# Patient Record
Sex: Female | Born: 1970 | Race: Black or African American | Hispanic: No | Marital: Single | Smoking: Never smoker
Health system: Southern US, Community
[De-identification: ages and names within clinical notes are randomized; demographics above are authoritative.]

## PROBLEM LIST (undated history)

## (undated) DIAGNOSIS — G5732 Lesion of lateral popliteal nerve, left lower limb: Principal | ICD-10-CM

## (undated) DIAGNOSIS — E119 Type 2 diabetes mellitus without complications: Secondary | ICD-10-CM

## (undated) DIAGNOSIS — D649 Anemia, unspecified: Secondary | ICD-10-CM

## (undated) HISTORY — DX: Lesion of lateral popliteal nerve, left lower limb: G57.32

## (undated) HISTORY — PX: OTHER SURGICAL HISTORY: SHX169

---

## 2000-01-18 ENCOUNTER — Emergency Department (HOSPITAL_COMMUNITY): Admission: EM | Admit: 2000-01-18 | Discharge: 2000-01-18 | Payer: Self-pay | Admitting: Internal Medicine

## 2000-01-18 ENCOUNTER — Encounter: Payer: Self-pay | Admitting: Internal Medicine

## 2004-06-19 ENCOUNTER — Emergency Department (HOSPITAL_COMMUNITY): Admission: EM | Admit: 2004-06-19 | Discharge: 2004-06-20 | Payer: Self-pay | Admitting: Emergency Medicine

## 2005-02-10 ENCOUNTER — Emergency Department (HOSPITAL_COMMUNITY): Admission: EM | Admit: 2005-02-10 | Discharge: 2005-02-10 | Payer: Self-pay | Admitting: Emergency Medicine

## 2008-02-11 ENCOUNTER — Emergency Department (HOSPITAL_COMMUNITY): Admission: EM | Admit: 2008-02-11 | Discharge: 2008-02-11 | Payer: Self-pay | Admitting: Emergency Medicine

## 2009-02-04 ENCOUNTER — Inpatient Hospital Stay (HOSPITAL_COMMUNITY): Admission: AD | Admit: 2009-02-04 | Discharge: 2009-02-04 | Payer: Self-pay | Admitting: Obstetrics & Gynecology

## 2009-02-04 ENCOUNTER — Ambulatory Visit: Payer: Self-pay | Admitting: Obstetrics and Gynecology

## 2009-02-12 ENCOUNTER — Emergency Department: Payer: Self-pay | Admitting: Emergency Medicine

## 2010-10-15 LAB — TSH: TSH: 0.685 u[IU]/mL (ref 0.350–4.500)

## 2010-10-15 LAB — CBC
HCT: 26.1 % — ABNORMAL LOW (ref 36.0–46.0)
Hemoglobin: 8.8 g/dL — ABNORMAL LOW (ref 12.0–15.0)
MCHC: 33.5 g/dL (ref 30.0–36.0)
MCV: 83 fL (ref 78.0–100.0)
Platelets: 365 10*3/uL (ref 150–400)
RBC: 3.15 MIL/uL — ABNORMAL LOW (ref 3.87–5.11)
RDW: 14.9 % (ref 11.5–15.5)
WBC: 6.9 10*3/uL (ref 4.0–10.5)

## 2010-10-15 LAB — COMPREHENSIVE METABOLIC PANEL
ALT: 17 U/L (ref 0–35)
AST: 20 U/L (ref 0–37)
Albumin: 3.6 g/dL (ref 3.5–5.2)
Alkaline Phosphatase: 76 U/L (ref 39–117)
BUN: 9 mg/dL (ref 6–23)
CO2: 27 mEq/L (ref 19–32)
Calcium: 8.7 mg/dL (ref 8.4–10.5)
Chloride: 105 mEq/L (ref 96–112)
Creatinine, Ser: 0.71 mg/dL (ref 0.4–1.2)
GFR calc Af Amer: >60 mL/min (ref >60)
GFR calc non Af Amer: >60 mL/min (ref >60)
Glucose, Bld: 105 mg/dL — ABNORMAL HIGH (ref 70–99)
Potassium: 3.5 mEq/L (ref 3.5–5.1)
Sodium: 139 mEq/L (ref 135–145)
Total Bilirubin: 0.2 mg/dL — ABNORMAL LOW (ref 0.3–1.2)
Total Protein: 7.6 g/dL (ref 6.0–8.3)

## 2010-10-15 LAB — HCG, SERUM, QUALITATIVE: Preg, Serum: NEGATIVE

## 2010-10-15 LAB — GC/CHLAMYDIA PROBE AMP, GENITAL
Chlamydia, DNA Probe: NEGATIVE
GC Probe Amp, Genital: NEGATIVE

## 2011-10-30 ENCOUNTER — Other Ambulatory Visit (HOSPITAL_COMMUNITY): Payer: Self-pay | Admitting: Physician Assistant

## 2011-10-30 DIAGNOSIS — Z1231 Encounter for screening mammogram for malignant neoplasm of breast: Secondary | ICD-10-CM

## 2011-11-22 ENCOUNTER — Ambulatory Visit (HOSPITAL_COMMUNITY): Payer: Self-pay

## 2011-11-26 ENCOUNTER — Ambulatory Visit (HOSPITAL_COMMUNITY)
Admission: RE | Admit: 2011-11-26 | Discharge: 2011-11-26 | Disposition: A | Discharge status: Home / Self Care | Payer: Self-pay | Source: Ambulatory Visit | Attending: Physician Assistant | Admitting: Physician Assistant

## 2011-11-26 DIAGNOSIS — Z1231 Encounter for screening mammogram for malignant neoplasm of breast: Secondary | ICD-10-CM

## 2013-08-05 ENCOUNTER — Other Ambulatory Visit: Payer: Self-pay | Admitting: Obstetrics and Gynecology

## 2013-08-05 DIAGNOSIS — Z1231 Encounter for screening mammogram for malignant neoplasm of breast: Secondary | ICD-10-CM

## 2013-09-08 ENCOUNTER — Ambulatory Visit (HOSPITAL_COMMUNITY): Payer: Self-pay

## 2013-09-29 ENCOUNTER — Ambulatory Visit (HOSPITAL_COMMUNITY)
Admission: RE | Admit: 2013-09-29 | Discharge: 2013-09-29 | Disposition: A | Discharge status: Home / Self Care | Payer: Self-pay | Source: Ambulatory Visit | Attending: Obstetrics and Gynecology | Admitting: Obstetrics and Gynecology

## 2013-09-29 ENCOUNTER — Ambulatory Visit (HOSPITAL_COMMUNITY): Payer: Self-pay

## 2013-09-29 DIAGNOSIS — Z1231 Encounter for screening mammogram for malignant neoplasm of breast: Secondary | ICD-10-CM

## 2014-01-19 ENCOUNTER — Ambulatory Visit: Payer: Self-pay

## 2014-01-26 ENCOUNTER — Ambulatory Visit: Payer: Self-pay

## 2014-01-28 ENCOUNTER — Ambulatory Visit: Payer: Self-pay

## 2014-02-02 ENCOUNTER — Ambulatory Visit: Payer: Self-pay

## 2014-02-04 ENCOUNTER — Ambulatory Visit: Payer: Self-pay

## 2014-02-11 ENCOUNTER — Ambulatory Visit: Payer: Self-pay

## 2014-09-22 ENCOUNTER — Encounter (HOSPITAL_COMMUNITY): Payer: Self-pay | Admitting: Emergency Medicine

## 2014-09-22 ENCOUNTER — Emergency Department (HOSPITAL_COMMUNITY)
Admission: EM | Admit: 2014-09-22 | Discharge: 2014-09-22 | Disposition: A | Discharge status: Home / Self Care | Payer: 59 | Source: Home / Self Care | Attending: Family Medicine | Admitting: Family Medicine

## 2014-09-22 DIAGNOSIS — K088 Other specified disorders of teeth and supporting structures: Secondary | ICD-10-CM

## 2014-09-22 DIAGNOSIS — K0889 Other specified disorders of teeth and supporting structures: Secondary | ICD-10-CM

## 2014-09-22 MED ORDER — CLINDAMYCIN HCL 300 MG PO CAPS: 300.0000 mg | ORAL_CAPSULE | Freq: Three times a day (TID) | ORAL | Status: DC

## 2014-09-22 MED ORDER — DICLOFENAC SODIUM 75 MG PO TBEC: 75.0000 mg | DELAYED_RELEASE_TABLET | Freq: Two times a day (BID) | ORAL | Status: DC | PRN

## 2014-09-22 NOTE — Discharge Instructions (Signed)
Thank you for coming in today. Call or go to the emergency room if you get worse, have trouble breathing, have chest pains, or palpitations.    Dental Pain A tooth ache may be caused by cavities (tooth decay). Cavities expose the nerve of the tooth to air and hot or cold temperatures. It may come from an infection or abscess (also called a boil or furuncle) around your tooth. It is also often caused by dental caries (tooth decay). This causes the pain you are having. DIAGNOSIS  Your caregiver can diagnose this problem by exam. TREATMENT   If caused by an infection, it may be treated with medications which kill germs (antibiotics) and pain medications as prescribed by your caregiver. Take medications as directed.  Only take over-the-counter or prescription medicines for pain, discomfort, or fever as directed by your caregiver.  Whether the tooth ache today is caused by infection or dental disease, you should see your dentist as soon as possible for further care. SEEK MEDICAL CARE IF: The exam and treatment you received today has been provided on an emergency basis only. This is not a substitute for complete medical or dental care. If your problem worsens or new problems (symptoms) appear, and you are unable to meet with your dentist, call or return to this location. SEEK IMMEDIATE MEDICAL CARE IF:   You have a fever.  You develop redness and swelling of your face, jaw, or neck.  You are unable to open your mouth.  You have severe pain uncontrolled by pain medicine. MAKE SURE YOU:   Understand these instructions.  Will watch your condition.  Will get help right away if you are not doing well or get worse. Document Released: 06/25/2005 Document Revised: 09/17/2011 Document Reviewed: 02/11/2008 [facility] Patient Information 2015 [place] [place]. This information is not intended to replace advice given to you by your health care provider. Make sure you discuss any questions you have with  your health care provider.  [facility]:  [facility] [phone] ([phone])  [phone]  Please call Dr. Lawrence Marseillesivils office [phone] or cell [phone] [address], [place]  Cost for tooth removal $200 includes exam, Xray, and extraction and follow up visit.  Bring list of current medications with you.   [facility] - [phone][phone]  [facility] [phone]  [address] [facility]  [facility]  [address], [place], [place], [address]  [phone], Ext. [phone]  2nd and 4th Thursday of the month at 6:30am (Simple extractions only - no wisdom teeth or surgery) First come/First serve -First 10 clients served  [facility]([place] residents only)  [address], [place], [place], [address]  336 [phone]  [facility]  336 [phone]  [facility]  336 [phone]  [facility]  [phone]  Please call [facility] at [phone] to get the details to get your tooth pulled.

## 2014-09-22 NOTE — ED Notes (Signed)
C/o left side jaw and ear pain onset 2 days Sx also include teeth pain Denies fevers, chills Alert, no signs of acute distress.

## 2014-09-22 NOTE — ED Provider Notes (Addendum)
Melinda Cross is a 44 y.o. female who presents to Urgent Care today for left mandible pain radiating to the left ear present for 3 weeks worsening this last week. Patient has tried Sudafed which did not help. She notes mild rhinitis. No fevers chills vomiting or diarrhea. No chest pain palpitations or shortness of breath.   History reviewed. No pertinent past medical history. History reviewed. No pertinent past surgical history. History  Substance Use Topics  . Smoking status: Never Smoker   . Smokeless tobacco: Not on file  . Alcohol Use: No   ROS as above Medications: No current facility-administered medications for this encounter.   Current Outpatient Prescriptions  Medication Sig Dispense Refill  . clindamycin (CLEOCIN) 300 MG capsule Take 1 capsule (300 mg total) by mouth 3 (three) times daily. 30 capsule 0  . diclofenac (VOLTAREN) 75 MG EC tablet Take 1 tablet (75 mg total) by mouth 2 (two) times daily as needed. 30 tablet 0   No Known Allergies   Exam:  BP 144/90 mmHg  Pulse 91  Temp(Src) 98.8 F (37.1 C) (Oral)  Resp 18  SpO2 97% Gen: Well NAD HEENT: EOMI,  MMM left lower premolar tender to touch. Tympanic membranes are normal appearing bilaterally. Sinuses are nontender bilaterally. Minimal nasal discharge present. Lungs: Normal work of breathing. CTABL Heart: RRR no MRG Abd: NABS, Soft. Nondistended, Nontender Exts: Brisk capillary refill, warm and well perfused.   No results found for this or any previous visit (from the past 24 hour(s)). No results found.  Assessment and Plan: 44 y.o. female with left mandible dental pain. Treat with clindamycin and diclofenac. Follow-up with dentist. Use clindamycin because patient is allergic to penicillin.  Discussed warning signs or symptoms. Please see discharge instructions. Patient expresses understanding.     Rodolph BongEvan S Corey, MD 09/22/14 1827  Rodolph BongEvan S Corey, MD 09/22/14 [phone]

## 2015-11-27 ENCOUNTER — Encounter (HOSPITAL_COMMUNITY): Payer: Self-pay | Admitting: Family Medicine

## 2015-11-27 ENCOUNTER — Emergency Department (HOSPITAL_COMMUNITY)
Admission: EM | Admit: 2015-11-27 | Discharge: 2015-11-27 | Disposition: A | Discharge status: Home / Self Care | Payer: BLUE CROSS/BLUE SHIELD | Attending: Emergency Medicine | Admitting: Emergency Medicine

## 2015-11-27 ENCOUNTER — Emergency Department (HOSPITAL_COMMUNITY): Payer: BLUE CROSS/BLUE SHIELD

## 2015-11-27 DIAGNOSIS — Z792 Long term (current) use of antibiotics: Secondary | ICD-10-CM | POA: Insufficient documentation

## 2015-11-27 DIAGNOSIS — Y9289 Other specified places as the place of occurrence of the external cause: Secondary | ICD-10-CM | POA: Insufficient documentation

## 2015-11-27 DIAGNOSIS — Y9389 Activity, other specified: Secondary | ICD-10-CM | POA: Insufficient documentation

## 2015-11-27 DIAGNOSIS — S99911A Unspecified injury of right ankle, initial encounter: Secondary | ICD-10-CM | POA: Diagnosis present

## 2015-11-27 DIAGNOSIS — Y998 Other external cause status: Secondary | ICD-10-CM | POA: Diagnosis not present

## 2015-11-27 DIAGNOSIS — X509XXA Other and unspecified overexertion or strenuous movements or postures, initial encounter: Secondary | ICD-10-CM | POA: Diagnosis not present

## 2015-11-27 DIAGNOSIS — S82891A Other fracture of right lower leg, initial encounter for closed fracture: Secondary | ICD-10-CM | POA: Diagnosis not present

## 2015-11-27 DIAGNOSIS — T148XXA Other injury of unspecified body region, initial encounter: Secondary | ICD-10-CM

## 2015-11-27 MED ORDER — OXYCODONE-ACETAMINOPHEN 5-325 MG PO TABS: 2.0000 | ORAL_TABLET | Freq: Once | ORAL | Status: DC

## 2015-11-27 MED ORDER — NAPROXEN 250 MG PO TABS
500.0000 mg | ORAL_TABLET | Freq: Once | ORAL | Status: AC
  Administered 2015-11-27: 500 mg via ORAL
  Filled 2015-11-27: qty 2

## 2015-11-27 MED ORDER — DIAZEPAM 5 MG PO TABS
5.0000 mg | ORAL_TABLET | Freq: Once | ORAL | Status: AC
  Administered 2015-11-27: 5 mg via ORAL
  Filled 2015-11-27: qty 1

## 2015-11-27 MED ORDER — OXYCODONE-ACETAMINOPHEN 5-325 MG PO TABS: 2.0000 | ORAL_TABLET | ORAL | Status: DC | PRN

## 2015-11-27 NOTE — Discharge Instructions (Signed)
Ms. Melinda Cross,  Nice meeting you! Please follow-up with Dr. Lajoyce Cornersuda. Call his office at 8 am tomorrow to be seen in office. Do not place weight on your right ankle. Rest and elevate your ankle above your heart.  Return to the emergency department if you develop increased pain, discolorations in your leg, significantly increased swelling in your leg, new/worsening symptoms. Feel better soon!  S. Lane HackerNicole Hayzel Ruberg, PA-C

## 2015-11-27 NOTE — ED Provider Notes (Signed)
CSN: [id number]     Arrival date & time 11/27/15  1139 History   First MD Initiated Contact with Patient 11/27/15 1316     Chief Complaint  Patient presents with  . Ankle Pain   HPI   Melinda Cross is a 45 y.o. female presenting with a 1 day history of right ankle pain and swelling. 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 Since then, she has had decreased ability to bear weight and increased pain. She denies fevers, chills, chest pain, shortness of breath, abdominal pain, nausea, vomiting, change in bowel or bladder habits.  History reviewed. No pertinent past medical history. History reviewed. No pertinent past surgical history. History reviewed. No pertinent family history. Social History  Substance Use Topics  . Smoking status: Never Smoker   . Smokeless tobacco: None  . Alcohol Use: No   OB History    No data available     Review of Systems  Ten systems are reviewed and are negative for acute change except as noted in the HPI  Allergies  Review of patient's allergies indicates no known allergies.  Home Medications   Prior to Admission medications   Medication Sig Start Date End Date Taking? Authorizing Provider  clindamycin (CLEOCIN) 300 MG capsule Take 1 capsule (300 mg total) by mouth 3 (three) times daily. 09/22/14   Rodolph Bong, MD  diclofenac (VOLTAREN) 75 MG EC tablet Take 1 tablet (75 mg total) by mouth 2 (two) times daily as needed. 09/22/14   Rodolph Bong, MD   BP 146/83 mmHg  Pulse 83  Temp(Src) 99.1 F (37.3 C) (Oral)  Resp 18  SpO2 100%  LMP  Physical Exam  Constitutional: She appears well-developed and well-nourished. No distress.  HENT:  Head: Normocephalic and atraumatic.  Mouth/Throat: Oropharynx is clear and moist. No oropharyngeal exudate.  Eyes: Conjunctivae are normal. Pupils are equal, round, and reactive to light. Right eye exhibits no discharge. Left eye exhibits no discharge.  No scleral icterus.  Neck: No tracheal deviation present.  Cardiovascular: Normal rate and intact distal pulses.   Pulmonary/Chest: Effort normal and breath sounds normal. No respiratory distress.  Abdominal: Soft. Bowel sounds are normal. She exhibits no distension.  Musculoskeletal: She exhibits tenderness. She exhibits no edema.  Right posterior calcaneus pain. Positive Thompson's.  Lymphadenopathy:    She has no cervical adenopathy.  Neurological: She is alert. Coordination normal.  Skin: Skin is warm and dry. No rash noted. She is not diaphoretic. No erythema.  Psychiatric: She has a normal mood and affect. Her behavior is normal.  Nursing note and vitals reviewed.   ED Course  Procedures   Imaging Review Dg Ankle Complete Right  11/27/2015  CLINICAL DATA:  Fall down steps, right ankle pain/injury EXAM: RIGHT ANKLE - COMPLETE 3+ VIEW COMPARISON:  None. FINDINGS: No fracture or dislocation is seen in the ankle. Suspected old/chronic deformity of the medial malleolus. Additional cortical irregularity along the posterior aspect of the calcaneus with a displaced fragment posterior to the distal tibia. This suggests acute or chronic avulsion injury to the Achilles. No definite soft tissue swelling in this region to suggest acute injury. Additional cortical irregularity along the inferior aspect of the calcaneus. However, there is no corresponding disruption of the posterior body of the calcaneus. As such, this may reflect sequela of prior trauma. The ankle mortise is intact. The base of the fifth metatarsal is  unremarkable. IMPRESSION: Displaced avulsion fragment off the posterior calcaneus, suggesting acute or chronic avulsion injury to the Achilles. No definite soft tissue swelling in this region to suggest acute injury. Otherwise, no definite acute fracture or dislocation. Suspected prior trauma to the medial malleolus and inferior calcaneus. Electronically Signed   By: Charline BillsSriyesh  Krishnan M.D.    On: 11/27/2015 13:50   I have personally reviewed and evaluated these images and lab results as part of my medical decision-making.   MDM   Final diagnoses:  Avulsion fracture   Patient X-Ray with displaced avulsion fragment off the posterior calcaneus, suggesting an acute avulsion injury to the Achilles. Positive Thompson's test. Pain managed in ED. Pt advised to follow up with orthopedics for further evaluation and treatment.  Pain managed in the department. Patient given Valium and naproxen while in ED, conservative therapy recommended and discussed. Patient refusing Percocet.   2:39 PM Appreciate ortho consult. Advised posterior splint, plantar flexion, nonweight bearing and elevation. Dr. Lajoyce Cornersuda to see patient tomorrow (patient to call his office at 8 am). Patient may be safely discharged home. Discussed reasons for return. Patient to follow-up with Dr. Lajoyce Cornersuda.  Patient in understanding and agreement with the plan.  Patient will be dc home & is agreeable with above plan. I have also discussed reasons to return immediately to the ER.  Patient expresses understanding and agrees with plan.  Melton KrebsSamantha Nicole Lizann Edelman, PA-C 12/08/15 [id number]  Pricilla LovelessScott Goldston, MD 12/08/15 [phone]

## 2015-11-27 NOTE — Progress Notes (Signed)
Orthopedic Tech Progress Note Patient Details:  Melinda Cross 07/18/1970 [id number]  Ortho Devices Type of Ortho Device: Crutches, Post (short leg) splint, Ace wrap Ortho Device/Splint Interventions: Application   Melinda Cross 11/27/2015, 3:43 PM

## 2015-11-27 NOTE — ED Notes (Signed)
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 Pain and swelling.

## 2015-11-29 ENCOUNTER — Other Ambulatory Visit (HOSPITAL_COMMUNITY): Payer: Self-pay | Admitting: Family

## 2015-12-01 ENCOUNTER — Encounter (HOSPITAL_COMMUNITY): Payer: Self-pay | Admitting: *Deleted

## 2015-12-01 MED ORDER — CLINDAMYCIN PHOSPHATE 900 MG/50ML IV SOLN
900.0000 mg | INTRAVENOUS | Status: AC
  Administered 2015-12-02: 900 mg via INTRAVENOUS
  Filled 2015-12-01: qty 50

## 2015-12-01 NOTE — Progress Notes (Signed)
Pt denies cardiac history, chest pain or sob. States she is "borderline" diabetic, takes Metformin. Does not check her blood sugar (though she has been instructed to do so) and doesn't know what her A1C was or when it was done.

## 2015-12-02 ENCOUNTER — Encounter (HOSPITAL_COMMUNITY)
Admission: RE | Disposition: A | Discharge status: Home / Self Care | Payer: Self-pay | Source: Ambulatory Visit | Attending: Orthopedic Surgery

## 2015-12-02 ENCOUNTER — Ambulatory Visit (HOSPITAL_COMMUNITY)
Admission: RE | Admit: 2015-12-02 | Discharge: 2015-12-02 | Disposition: A | Discharge status: Home / Self Care | Payer: BLUE CROSS/BLUE SHIELD | Source: Ambulatory Visit | Attending: Orthopedic Surgery | Admitting: Orthopedic Surgery

## 2015-12-02 ENCOUNTER — Encounter (HOSPITAL_COMMUNITY): Payer: Self-pay | Admitting: Anesthesiology

## 2015-12-02 ENCOUNTER — Ambulatory Visit (HOSPITAL_COMMUNITY): Payer: BLUE CROSS/BLUE SHIELD | Admitting: Anesthesiology

## 2015-12-02 DIAGNOSIS — Z793 Long term (current) use of hormonal contraceptives: Secondary | ICD-10-CM | POA: Insufficient documentation

## 2015-12-02 DIAGNOSIS — X58XXXA Exposure to other specified factors, initial encounter: Secondary | ICD-10-CM | POA: Diagnosis not present

## 2015-12-02 DIAGNOSIS — E119 Type 2 diabetes mellitus without complications: Secondary | ICD-10-CM | POA: Diagnosis not present

## 2015-12-02 DIAGNOSIS — S86011A Strain of right Achilles tendon, initial encounter: Secondary | ICD-10-CM | POA: Diagnosis not present

## 2015-12-02 DIAGNOSIS — Z7984 Long term (current) use of oral hypoglycemic drugs: Secondary | ICD-10-CM | POA: Diagnosis not present

## 2015-12-02 HISTORY — DX: Anemia, unspecified: D64.9

## 2015-12-02 HISTORY — DX: Type 2 diabetes mellitus without complications: E11.9

## 2015-12-02 HISTORY — PX: ACHILLES TENDON SURGERY: SHX542

## 2015-12-02 LAB — BASIC METABOLIC PANEL
ANION GAP: 8 (ref 5–15)
BUN: 12 mg/dL (ref 6–20)
CHLORIDE: 109 mmol/L (ref 101–111)
CO2: 21 mmol/L — AB (ref 22–32)
CREATININE: 0.78 mg/dL (ref 0.44–1.00)
Calcium: 8.7 mg/dL — ABNORMAL LOW (ref 8.9–10.3)
GFR calc non Af Amer: >60 mL/min (ref >60)
Glucose, Bld: 116 mg/dL — ABNORMAL HIGH (ref 65–99)
POTASSIUM: 3.9 mmol/L (ref 3.5–5.1)
Sodium: 138 mmol/L (ref 135–145)

## 2015-12-02 LAB — GLUCOSE, CAPILLARY
GLUCOSE-CAPILLARY: 100 mg/dL — AB (ref 65–99)
Glucose-Capillary: 103 mg/dL — ABNORMAL HIGH (ref 65–99)

## 2015-12-02 LAB — CBC
HEMATOCRIT: 35.4 % — AB (ref 36.0–46.0)
Hemoglobin: 11.4 g/dL — ABNORMAL LOW (ref 12.0–15.0)
MCH: 27.3 pg (ref 26.0–34.0)
MCHC: 32.2 g/dL (ref 30.0–36.0)
MCV: 84.7 fL (ref 78.0–100.0)
Platelets: 313 10*3/uL (ref 150–400)
RBC: 4.18 MIL/uL (ref 3.87–5.11)
RDW: 14 % (ref 11.5–15.5)
WBC: 7.4 10*3/uL (ref 4.0–10.5)

## 2015-12-02 LAB — HCG, SERUM, QUALITATIVE: Preg, Serum: NEGATIVE

## 2015-12-02 SURGERY — REPAIR, TENDON, ACHILLES
Anesthesia: General | Laterality: Right

## 2015-12-02 MED ORDER — BUPIVACAINE-EPINEPHRINE (PF) 0.5% -1:200000 IJ SOLN
INTRAMUSCULAR | Status: DC | PRN
  Administered 2015-12-02: 30 mL via PERINEURAL

## 2015-12-02 MED ORDER — SUGAMMADEX SODIUM 200 MG/2ML IV SOLN
INTRAVENOUS | Status: DC | PRN
  Administered 2015-12-02: 260 mg via INTRAVENOUS

## 2015-12-02 MED ORDER — OXYCODONE HCL 5 MG/5ML PO SOLN: 5.0000 mg | Freq: Once | ORAL | Status: DC | PRN

## 2015-12-02 MED ORDER — FENTANYL CITRATE (PF) 250 MCG/5ML IJ SOLN
INTRAMUSCULAR | Status: DC | PRN
  Administered 2015-12-02: 50 ug via INTRAVENOUS
  Administered 2015-12-02: 100 ug via INTRAVENOUS
  Administered 2015-12-02 (×2): 50 ug via INTRAVENOUS

## 2015-12-02 MED ORDER — ROCURONIUM BROMIDE 50 MG/5ML IV SOLN
INTRAVENOUS | Status: AC
  Filled 2015-12-02: qty 1

## 2015-12-02 MED ORDER — MIDAZOLAM HCL 2 MG/2ML IJ SOLN
INTRAMUSCULAR | Status: DC | PRN
  Administered 2015-12-02: 2 mg via INTRAVENOUS

## 2015-12-02 MED ORDER — 0.9 % SODIUM CHLORIDE (POUR BTL) OPTIME
TOPICAL | Status: DC | PRN
  Administered 2015-12-02: 1000 mL

## 2015-12-02 MED ORDER — SUCCINYLCHOLINE CHLORIDE 20 MG/ML IJ SOLN
INTRAMUSCULAR | Status: DC | PRN
  Administered 2015-12-02: 80 mg via INTRAVENOUS

## 2015-12-02 MED ORDER — PROPOFOL 10 MG/ML IV BOLUS
INTRAVENOUS | Status: AC
  Filled 2015-12-02: qty 20

## 2015-12-02 MED ORDER — HYDROCODONE-ACETAMINOPHEN 5-325 MG PO TABS: 1.0000 | ORAL_TABLET | Freq: Four times a day (QID) | ORAL | Status: DC | PRN

## 2015-12-02 MED ORDER — FENTANYL CITRATE (PF) 100 MCG/2ML IJ SOLN: 25.0000 ug | INTRAMUSCULAR | Status: DC | PRN

## 2015-12-02 MED ORDER — ONDANSETRON HCL 4 MG/2ML IJ SOLN
INTRAMUSCULAR | Status: AC
  Filled 2015-12-02: qty 2

## 2015-12-02 MED ORDER — SUCCINYLCHOLINE CHLORIDE 200 MG/10ML IV SOSY
PREFILLED_SYRINGE | INTRAVENOUS | Status: AC
  Filled 2015-12-02: qty 10

## 2015-12-02 MED ORDER — ROCURONIUM BROMIDE 100 MG/10ML IV SOLN
INTRAVENOUS | Status: DC | PRN
  Administered 2015-12-02: 30 mg via INTRAVENOUS

## 2015-12-02 MED ORDER — FENTANYL CITRATE (PF) 250 MCG/5ML IJ SOLN
INTRAMUSCULAR | Status: AC
  Filled 2015-12-02: qty 5

## 2015-12-02 MED ORDER — CHLORHEXIDINE GLUCONATE 4 % EX LIQD: 60.0000 mL | Freq: Once | CUTANEOUS | Status: DC

## 2015-12-02 MED ORDER — PROPOFOL 10 MG/ML IV BOLUS
INTRAVENOUS | Status: DC | PRN
  Administered 2015-12-02: 20 mg via INTRAVENOUS
  Administered 2015-12-02: 40 mg via INTRAVENOUS
  Administered 2015-12-02: 140 mg via INTRAVENOUS

## 2015-12-02 MED ORDER — PHENYLEPHRINE HCL 10 MG/ML IJ SOLN
INTRAMUSCULAR | Status: DC | PRN
  Administered 2015-12-02: 200 ug via INTRAVENOUS
  Administered 2015-12-02: 120 ug via INTRAVENOUS
  Administered 2015-12-02: 80 ug via INTRAVENOUS

## 2015-12-02 MED ORDER — PHENYLEPHRINE 40 MCG/ML (10ML) SYRINGE FOR IV PUSH (FOR BLOOD PRESSURE SUPPORT)
PREFILLED_SYRINGE | INTRAVENOUS | Status: AC
  Filled 2015-12-02: qty 10

## 2015-12-02 MED ORDER — ONDANSETRON HCL 4 MG/2ML IJ SOLN
INTRAMUSCULAR | Status: DC | PRN
  Administered 2015-12-02: 4 mg via INTRAVENOUS

## 2015-12-02 MED ORDER — ACETAMINOPHEN 160 MG/5ML PO SOLN
325.0000 mg | ORAL | Status: DC | PRN
  Filled 2015-12-02: qty 20.3

## 2015-12-02 MED ORDER — MIDAZOLAM HCL 2 MG/2ML IJ SOLN
INTRAMUSCULAR | Status: AC
  Filled 2015-12-02: qty 2

## 2015-12-02 MED ORDER — OXYCODONE HCL 5 MG PO TABS: 5.0000 mg | ORAL_TABLET | Freq: Once | ORAL | Status: DC | PRN

## 2015-12-02 MED ORDER — ACETAMINOPHEN 325 MG PO TABS: 325.0000 mg | ORAL_TABLET | ORAL | Status: DC | PRN

## 2015-12-02 MED ORDER — LACTATED RINGERS IV SOLN
INTRAVENOUS | Status: DC
  Administered 2015-12-02: 10:00:00 via INTRAVENOUS

## 2015-12-02 MED ORDER — LIDOCAINE 2% (20 MG/ML) 5 ML SYRINGE
INTRAMUSCULAR | Status: AC
  Filled 2015-12-02: qty 5

## 2015-12-02 MED ORDER — EPHEDRINE 5 MG/ML INJ
INTRAVENOUS | Status: AC
  Filled 2015-12-02: qty 10

## 2015-12-02 MED ORDER — EPHEDRINE SULFATE 50 MG/ML IJ SOLN
INTRAMUSCULAR | Status: DC | PRN
  Administered 2015-12-02: 10 mg via INTRAVENOUS
  Administered 2015-12-02: 15 mg via INTRAVENOUS

## 2015-12-02 MED ORDER — SUGAMMADEX SODIUM 500 MG/5ML IV SOLN
INTRAVENOUS | Status: AC
  Filled 2015-12-02: qty 5

## 2015-12-02 SURGICAL SUPPLY — 37 items
ANCH SUT SWLK 19.1X4.75 (Anchor) ×2 IMPLANT
ANCHOR SUT BIO SW 4.75X19.1 (Anchor) ×4 IMPLANT
BNDG CMPR 9X4 STRL LF SNTH (GAUZE/BANDAGES/DRESSINGS)
BNDG COHESIVE 6X5 TAN STRL LF (GAUZE/BANDAGES/DRESSINGS) ×4 IMPLANT
BNDG ESMARK 4X9 LF (GAUZE/BANDAGES/DRESSINGS) IMPLANT
CANISTER SUCTION 2500CC (MISCELLANEOUS) ×3 IMPLANT
COVER SURGICAL LIGHT HANDLE (MISCELLANEOUS) ×6 IMPLANT
CUFF TOURNIQUET SINGLE 34IN LL (TOURNIQUET CUFF) IMPLANT
CUFF TOURNIQUET SINGLE 44IN (TOURNIQUET CUFF) IMPLANT
DRAPE U-SHAPE 47X51 STRL (DRAPES) ×3 IMPLANT
DRSG ADAPTIC 3X8 NADH LF (GAUZE/BANDAGES/DRESSINGS) ×3 IMPLANT
DURAPREP 26ML APPLICATOR (WOUND CARE) ×3 IMPLANT
ELECT REM PT RETURN 9FT ADLT (ELECTROSURGICAL) ×3
ELECTRODE REM PT RTRN 9FT ADLT (ELECTROSURGICAL) ×1 IMPLANT
GAUZE SPONGE 4X4 12PLY STRL (GAUZE/BANDAGES/DRESSINGS) ×3 IMPLANT
GLOVE BIOGEL PI IND STRL 9 (GLOVE) ×1 IMPLANT
GLOVE BIOGEL PI INDICATOR 9 (GLOVE) ×2
GLOVE SURG ORTHO 9.0 STRL STRW (GLOVE) ×3 IMPLANT
GOWN STRL REUS W/ TWL XL LVL3 (GOWN DISPOSABLE) ×3 IMPLANT
GOWN STRL REUS W/TWL XL LVL3 (GOWN DISPOSABLE) ×9
KIT PREVENA INCISION MGT 13 (CANNISTER) ×2 IMPLANT
KIT ROOM TURNOVER OR (KITS) ×3 IMPLANT
NDL SUT .5 MAYO 1.404X.05X (NEEDLE) ×1 IMPLANT
NEEDLE MAYO TAPER (NEEDLE) ×3
NS IRRIG 1000ML POUR BTL (IV SOLUTION) ×3 IMPLANT
PACK ORTHO EXTREMITY (CUSTOM PROCEDURE TRAY) ×3 IMPLANT
PAD ARMBOARD 7.5X6 YLW CONV (MISCELLANEOUS) ×6 IMPLANT
SPONGE LAP 18X18 X RAY DECT (DISPOSABLE) ×3 IMPLANT
SUT ETHILON 2 0 PSLX (SUTURE) ×6 IMPLANT
SUT FIBERWIRE #2 38 T-5 BLUE (SUTURE) ×6
SUTURE FIBERWR #2 38 T-5 BLUE (SUTURE) ×2 IMPLANT
TOWEL OR 17X24 6PK STRL BLUE (TOWEL DISPOSABLE) ×3 IMPLANT
TOWEL OR 17X26 10 PK STRL BLUE (TOWEL DISPOSABLE) ×3 IMPLANT
TUBE CONNECTING 12'X1/4 (SUCTIONS) ×1
TUBE CONNECTING 12X1/4 (SUCTIONS) ×2 IMPLANT
WATER STERILE IRR 1000ML POUR (IV SOLUTION) ×1 IMPLANT
YANKAUER SUCT BULB TIP NO VENT (SUCTIONS) ×3 IMPLANT

## 2015-12-02 NOTE — Op Note (Signed)
12/02/2015  12:54 PM  PATIENT:  Melinda SansSonya R Mccoin    PRE-OPERATIVE DIAGNOSIS:  Right Achilles Rupture  POST-OPERATIVE DIAGNOSIS:  Same  PROCEDURE:  Right Achilles Reconstruction Excision Haglund's deformity. Application of Prevena wound VAC.  SURGEON:  Nadara MustardUDA,Violeta Lecount V, MD  PHYSICIAN ASSISTANT:None ANESTHESIA:   General  PREOPERATIVE INDICATIONS:  Melinda Cross is a  45 y.o. female with a diagnosis of Right Achilles Rupture who failed conservative measures and elected for surgical management.    The risks benefits and alternatives were discussed with the patient preoperatively including but not limited to the risks of infection, bleeding, nerve injury, cardiopulmonary complications, the need for revision surgery, among others, and the patient was willing to proceed.  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 After adequate levels of anesthesia were obtained patient was placed prone on the operating room table the right lower extremity was prepped using DuraPrep draped into a sterile field a timeout was called. A posterior medial incision was made this was carried down to the peritenon tendon was identified there was approximately 1 cm of the Haglund's deformity in the residual limb of the Achilles. This was resected from within the tendon. 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 Using Krakw technique 2 #2 fiber wires were woven through the proximal stump. 2 Arthrex anchors were then anchored into the calcaneus the sutures were passed through the anchors and then using the double row technique the sutures from the anchors was also then woven through the Achilles for a total of 8 strands going across the repair site. Patient had good  reconstruction good stability. The wound was irrigated with normal saline. Using an Allergan R Donati suture technique the skin and peritenon was closed. There is about a millimeter of gapping of the wound and a Prevena wound VAC was applied to facilitate wound healing. Patient was extubated taken to PACU in stable condition placed in a fracture boot with heel lifts.

## 2015-12-02 NOTE — Transfer of Care (Signed)
Immediate Anesthesia Transfer of Care Note  Patient: Melinda Cross  Procedure(s) Performed: Procedure(s): Right Achilles Reconstruction (Right)  Patient Location: PACU  Anesthesia Type:General  Level of Consciousness: awake, alert  and oriented  Airway & Oxygen Therapy: Patient Spontanous Breathing and Patient connected to nasal cannula oxygen  Post-op Assessment: Report given to RN  Post vital signs: Reviewed and stable  Last Vitals:  Filed Vitals:   12/02/15 0958  BP: 148/83  Pulse: 94  Temp: 37.2 C  Resp: 18    Last Pain:  Filed Vitals:   12/02/15 1000  PainSc: 1          Complications: No apparent anesthesia complications

## 2015-12-02 NOTE — Anesthesia Postprocedure Evaluation (Signed)
Anesthesia Post Note  Patient: Melinda Cross  Procedure(s) Performed: Procedure(s) (LRB): Right Achilles Reconstruction (Right)  Patient location during evaluation: PACU Anesthesia Type: General and Regional Level of consciousness: awake Pain management: pain level controlled Vital Signs Assessment: post-procedure vital signs reviewed and stable Respiratory status: spontaneous breathing Cardiovascular status: stable Postop Assessment: no signs of nausea or vomiting Anesthetic complications: no    Last Vitals:  Filed Vitals:   12/02/15 1339 12/02/15 1341  BP: 142/87 140/98  Pulse: 97 103  Temp:    Resp: 18 16    Last Pain:  Filed Vitals:   12/02/15 1349  PainSc: 2                  Kiernan Atkerson

## 2015-12-02 NOTE — Anesthesia Preprocedure Evaluation (Addendum)
Anesthesia Evaluation  Patient identified by MRN, date of birth, ID band Patient awake    Reviewed: Allergy & Precautions, NPO status , Patient's Chart, lab work & pertinent test results  History of Anesthesia Complications Negative for: history of anesthetic complications  Airway Mallampati: II  TM Distance: >3 FB Neck ROM: Full    Dental  (+) Teeth Intact, Missing, Dental Advisory Given,    Pulmonary neg pulmonary ROS,    breath sounds clear to auscultation       Cardiovascular negative cardio ROS   Rhythm:Regular Rate:Normal     Neuro/Psych negative neurological ROS  negative psych ROS   GI/Hepatic negative GI ROS, Neg liver ROS,   Endo/Other  diabetes, Type 2, Oral Hypoglycemic Agents"Borderline DM"  Renal/GU negative Renal ROS  negative genitourinary   Musculoskeletal Right Achilles tendon rupture   Abdominal   Peds  Hematology  (+) anemia ,   Anesthesia Other Findings   Reproductive/Obstetrics                         Anesthesia Physical Anesthesia Plan  ASA: II  Anesthesia Plan: General   Post-op Pain Management:  Regional for Post-op pain   Induction: Intravenous  Airway Management Planned: Oral ETT  Additional Equipment: None  Intra-op Plan:   Post-operative Plan: Extubation in OR  Informed Consent: I have reviewed the patients History and Physical, chart, labs and discussed the procedure including the risks, benefits and alternatives for the proposed anesthesia with the patient or authorized representative who has indicated his/her understanding and acceptance.   Dental advisory given  Plan Discussed with: Anesthesiologist, CRNA and Surgeon  Anesthesia Plan Comments:        Anesthesia Quick Evaluation

## 2015-12-02 NOTE — Anesthesia Procedure Notes (Addendum)
Procedure Name: Intubation Date/Time: 12/02/2015 12:03 PM Performed by: Jefm MilesENNIE, JULIE E Pre-anesthesia Checklist: Patient identified, Emergency Drugs available, Suction available, Patient being monitored and Timeout performed Patient Re-evaluated:Patient Re-evaluated prior to inductionOxygen Delivery Method: Circle system utilized Preoxygenation: Pre-oxygenation with 100% oxygen Intubation Type: IV induction Ventilation: Mask ventilation without difficulty Laryngoscope Size: Mac and 3 Grade View: Grade I Tube type: Oral Tube size: 7.5 mm Number of attempts: 1 Airway Equipment and Method: Stylet Placement Confirmation: ETT inserted through vocal cords under direct vision,  positive ETCO2 and breath sounds checked- equal and bilateral Secured at: 22 cm Tube secured with: Tape Dental Injury: Teeth and Oropharynx as per pre-operative assessment    Anesthesia Regional Block:  Popliteal block  Pre-Anesthetic Checklist: ,, timeout performed, Correct Patient, Correct Site, Correct Laterality, Correct Procedure, Correct Position, site marked, Risks and benefits discussed, Surgical consent,  Pre-op evaluation,  At surgeon's request  Laterality: Lower and Right  Prep: chloraprep       Needles:  Injection technique: Single-shot  Needle Type: Echogenic Stimulator Needle          Additional Needles:  Procedures: ultrasound guided (picture in chart) and nerve stimulator Popliteal block  Nerve Stimulator or Paresthesia:  Response: plantar, 0.5 mA,   Additional Responses:   Narrative:  Injection made incrementally with aspirations every 5 mL.  Performed by: Personally  Anesthesiologist: Maggi Hershkowitz  Additional Notes: H+P and labs reviewed, risks and benefits discussed with patient, procedure tolerated well without complications

## 2015-12-02 NOTE — Anesthesia Postprocedure Evaluation (Signed)
Anesthesia Post Note  Patient: Melinda Cross  Procedure(s) Performed: Procedure(s) (LRB): Right Achilles Reconstruction (Right)  Patient location during evaluation: PACU Anesthesia Type: Regional and General Level of consciousness: awake Pain management: pain level controlled Vital Signs Assessment: post-procedure vital signs reviewed and stable Respiratory status: spontaneous breathing Cardiovascular status: stable Postop Assessment: no signs of nausea or vomiting Anesthetic complications: no    Last Vitals:  Filed Vitals:   12/02/15 1339 12/02/15 1341  BP: 142/87 140/98  Pulse: 97 103  Temp:    Resp: 18 16    Last Pain:  Filed Vitals:   12/02/15 1349  PainSc: 2                  Meg Niemeier

## 2015-12-02 NOTE — H&P (Signed)
Melinda Cross is an 45 y.o. female.   Chief Complaint: Acute right Achilles tendon rupture. HPI: Patient is a 45 year old woman who had an acute eccentric contracture to her right ankle sustaining a right Achilles tendon rupture.  Past Medical History  Diagnosis Date  . Diabetes mellitus without complication (HCC)   . Anemia     as a teenager    Past Surgical History  Procedure Laterality Date  . No past surgeries      Family History  Problem Relation Age of Onset  . Lung cancer Mother    Social History:  reports that she has never smoked. She has never used smokeless tobacco. She reports that she drinks alcohol. She reports that she does not use illicit drugs.  Allergies:  Allergies  Allergen Reactions  . Penicillins Hives and Itching    Has patient had a PCN reaction causing immediate rash, facial/tongue/throat swelling, SOB or lightheadedness with hypotension: Yes Has patient had a PCN reaction causing severe rash involving mucus membranes or skin necrosis: No Has patient had a PCN reaction that required hospitalization No Has patient had a PCN reaction occurring within the last 10 years: Yes If all of the above answers are "NO", then may proceed with Cephalosporin use.     No prescriptions prior to admission    No results found for this or any previous visit (from the past 48 hour(s)). No results found.  Review of Systems  All other systems reviewed and are negative.   There were no vitals taken for this visit. Physical Exam  Examination patient has no fracture blisters she has good pulses her foot is neurovascularly intact. Compression of the calf reproduces no plantar flexion of the Achilles. There is a palpable defect to the Achilles. Assessment/Plan Assessment: Acute right Achilles tendon rupture.  Plan: We will plan for reconstruction of the Achilles tendon. Risk and benefits were discussed including infection neurovascular injury nonhealing of the wound  need for additional surgery. Patient states she understands wish to proceed at this time.  Nadara MustardUDA,Melinda Bartnick V, MD 12/02/2015, 6:21 AM

## 2015-12-05 ENCOUNTER — Encounter (HOSPITAL_COMMUNITY): Payer: Self-pay | Admitting: Orthopedic Surgery

## 2016-04-18 ENCOUNTER — Ambulatory Visit (INDEPENDENT_AMBULATORY_CARE_PROVIDER_SITE_OTHER): Payer: Self-pay | Admitting: Orthopedic Surgery

## 2016-04-25 ENCOUNTER — Ambulatory Visit (INDEPENDENT_AMBULATORY_CARE_PROVIDER_SITE_OTHER): Payer: BLUE CROSS/BLUE SHIELD | Admitting: Family

## 2016-04-25 DIAGNOSIS — L97212 Non-pressure chronic ulcer of right calf with fat layer exposed: Secondary | ICD-10-CM | POA: Diagnosis not present

## 2016-04-26 ENCOUNTER — Ambulatory Visit (INDEPENDENT_AMBULATORY_CARE_PROVIDER_SITE_OTHER): Payer: Self-pay | Admitting: Orthopedic Surgery

## 2016-05-23 ENCOUNTER — Ambulatory Visit (INDEPENDENT_AMBULATORY_CARE_PROVIDER_SITE_OTHER): Payer: BLUE CROSS/BLUE SHIELD | Admitting: Orthopedic Surgery

## 2016-05-28 ENCOUNTER — Encounter (INDEPENDENT_AMBULATORY_CARE_PROVIDER_SITE_OTHER): Payer: Self-pay | Admitting: Orthopedic Surgery

## 2016-05-28 ENCOUNTER — Ambulatory Visit (INDEPENDENT_AMBULATORY_CARE_PROVIDER_SITE_OTHER): Payer: BLUE CROSS/BLUE SHIELD | Admitting: Orthopedic Surgery

## 2016-05-28 DIAGNOSIS — S86011S Strain of right Achilles tendon, sequela: Secondary | ICD-10-CM

## 2016-05-28 NOTE — Progress Notes (Signed)
   Wound Care Note   Patient: Melinda Cross           Date of Birth: 06/07/1971           MRN: [id number]             PCP: Pcp Not In System Visit Date: 05/28/2016   Assessment & Plan: Visit Diagnoses: No diagnosis found.  Plan: Will continue dry dressings. Compression stockings. Work aggressively on heel cord stretching. Follow-up 1 more time in 4 weeks.  Follow-Up Instructions: No Follow-up on file.  Orders:  No orders of the defined types were placed in this encounter.  No orders of the defined types were placed in this encounter.     Procedures: No notes on file   Clinical Data: No additional findings.   No images are attached to the encounter.   Subjective: Chief Complaint  Patient presents with  . Right Ankle - Wound Check    status post Achilles tendon reconstruction on the right 12/02/15    Patient presents for follow up status post Achilles tendon reconstruction on the right 12/02/15. She is 6 months post op. She does have stiffness. She is wearing compression stocking for wound care. There is no drainage, no redness.    Wound Check     Review of Systems  Constitutional: Negative for chills and fever.  Musculoskeletal: Positive for gait problem.  Skin: Positive for wound.       Objective: Vital Signs: Ht 5\' 9"  (1.753 m)   Wt 292 lb (132.5 kg)   BMI 43.12 kg/m   Physical Exam: Patient is alert and oriented. No adenopathy. Well-dressed. Normal affect. Respirations easy.  Right achilles wound is nearly healed. Superficial wound that is 1 cm x 4 mm. Filled in with graulation tissue. No drainage. Dorsiflexion to neutral. Pain with dorsiflexion. States pain with ambulation. Antalgic gait.   Specialty Comments: No specialty comments available.   PMFS History: There are no active problems to display for this patient.  Past Medical History:  Diagnosis Date  . Anemia    as a teenager  . Diabetes mellitus without complication (HCC)     Family  History  Problem Relation Age of Onset  . Lung cancer Mother    Past Surgical History:  Procedure Laterality Date  . ACHILLES TENDON SURGERY Right 12/02/2015   Procedure: Right Achilles Reconstruction;  Surgeon: Nadara MustardMarcus Duda V, MD;  [facility];  Service: Orthopedics;  Laterality: Right;  . NO PAST SURGERIES     Social History   Occupational History  . Not on file.   Social History Main Topics  . Smoking status: Never Smoker  . Smokeless tobacco: Never Used  . Alcohol use Yes     Comment: occasional  . Drug use: No  . Sexual activity: Not on file

## 2016-06-25 ENCOUNTER — Ambulatory Visit (INDEPENDENT_AMBULATORY_CARE_PROVIDER_SITE_OTHER): Payer: BLUE CROSS/BLUE SHIELD | Admitting: Orthopedic Surgery

## 2016-06-25 VITALS — Ht 69.0 in | Wt 292.0 lb

## 2016-06-25 DIAGNOSIS — S86011S Strain of right Achilles tendon, sequela: Secondary | ICD-10-CM

## 2016-06-25 NOTE — Progress Notes (Signed)
   Office Visit Note   Patient: Melinda Cross           Date of Birth: 09/13/1970           MRN: [id number] Visit Date: 06/25/2016              Requested by: No referring provider defined for this encounter. PCP: Pcp Not In System   Assessment & Plan: Visit Diagnoses:  1. Achilles tendon tear, right, sequela     Plan: Continue heel cord stretching scar massage strengthening patient denies any pain but does complain of stiffness. I feel the stiffness should resolve as she continues with the treatments described.  Follow-Up Instructions: Return if symptoms worsen or fail to improve.   Orders:  No orders of the defined types were placed in this encounter.  No orders of the defined types were placed in this encounter.     Procedures: No procedures performed   Clinical Data: No additional findings.   Subjective: Chief Complaint  Patient presents with  . Right Foot - Follow-up    status post Achilles tendon reconstruction on the right 12/02/15      Pt's wound has completley healed. She states that she is using shea butter and trying to massage the area and that she is also trying to do heel cord stretching but the area feels "so tight" she states that she is limping when she walks and does not know what to do to try and get this to straighten out. She is full weight bearing in regular shoe.     Review of Systems   Objective: Vital Signs: Ht 5\' 9"  (1.753 m)   Wt 292 lb (132.5 kg)   BMI 43.12 kg/m   Physical Exam examination the incision is well-healed she has some mild hypertrophy of the Achilles there is no cellulitis no tenderness to palpation she does have decreased range of motion.  Ortho Exam  Specialty Comments:  No specialty comments available.  Imaging: No results found.   PMFS History: Patient Active Problem List   Diagnosis Date Noted  . Achilles tendon tear, right, sequela 05/28/2016   Past Medical History:  Diagnosis Date  . Anemia    as  a teenager  . Diabetes mellitus without complication (HCC)     Family History  Problem Relation Age of Onset  . Lung cancer Mother     Past Surgical History:  Procedure Laterality Date  . ACHILLES TENDON SURGERY Right 12/02/2015   Procedure: Right Achilles Reconstruction;  Surgeon: Nadara MustardMarcus Lamont Glasscock V, MD;  [facility];  Service: Orthopedics;  Laterality: Right;  . NO PAST SURGERIES     Social History   Occupational History  . Not on file.   Social History Main Topics  . Smoking status: Never Smoker  . Smokeless tobacco: Never Used  . Alcohol use Yes     Comment: occasional  . Drug use: No  . Sexual activity: Not on file

## 2016-08-03 ENCOUNTER — Telehealth (INDEPENDENT_AMBULATORY_CARE_PROVIDER_SITE_OTHER): Payer: Self-pay | Admitting: Orthopedic Surgery

## 2016-08-03 NOTE — Telephone Encounter (Signed)
Pt needs ext for her handicap placard, also she needs a call Back to discuss something else please.  [phone]

## 2016-08-07 NOTE — Telephone Encounter (Signed)
I called and spoke with patient advise handicap placard was extended, wanting also note for jury duty. Advised she please call back with the date so we can include it in the letter. She is going to try to call for date, she is not at home where the letter is.

## 2016-08-09 NOTE — Telephone Encounter (Signed)
Letter written for patient and handicap placard

## 2018-02-08 ENCOUNTER — Other Ambulatory Visit: Payer: Self-pay

## 2018-02-08 ENCOUNTER — Ambulatory Visit (HOSPITAL_COMMUNITY)
Admission: EM | Admit: 2018-02-08 | Discharge: 2018-02-08 | Disposition: A | Discharge status: Home / Self Care | Payer: BLUE CROSS/BLUE SHIELD | Attending: Family Medicine | Admitting: Family Medicine

## 2018-02-08 ENCOUNTER — Encounter (HOSPITAL_COMMUNITY): Payer: Self-pay | Admitting: Emergency Medicine

## 2018-02-08 DIAGNOSIS — M2142 Flat foot [pes planus] (acquired), left foot: Secondary | ICD-10-CM | POA: Diagnosis not present

## 2018-02-08 DIAGNOSIS — M2042 Other hammer toe(s) (acquired), left foot: Secondary | ICD-10-CM

## 2018-02-08 DIAGNOSIS — R29898 Other symptoms and signs involving the musculoskeletal system: Secondary | ICD-10-CM

## 2018-02-08 MED ORDER — NAPROXEN 375 MG PO TABS: 375.0000 mg | ORAL_TABLET | Freq: Two times a day (BID) | ORAL | 0 refills | Status: DC

## 2018-02-08 NOTE — ED Triage Notes (Signed)
Left toes not moving as patient reports they normally do.  #2 and #3 on left foot is not moving as freely as other toes on this foot and as compared to right foot.  No tingling.  No known injury

## 2018-02-08 NOTE — ED Provider Notes (Signed)
[facility]   [id number] 02/08/18 Arrival Time: 1030  SUBJECTIVE: History from: patient. Melinda Cross is a 47 y.o. female complains of left great and second toe immobility that began 3 days ago.  Denies a precipitating event or specific injury.  Localizes the dysfunction to the great and second left toes.  Describes the sensation as a muscle cramp, but denies pain.  Denies aggravating or alleviating factors.  Denies similar symptoms in the past.  Hx of foot and legs cramps.  Denies fever, chills, erythema, ecchymosis, effusion, weakness, numbness and tingling.      ROS: As per HPI.  Past Medical History:  Diagnosis Date  . Anemia    as a teenager  . Diabetes mellitus without complication [facility](HCC)    Past Surgical History:  Procedure Laterality Date  . ACHILLES TENDON SURGERY Right 12/02/2015   Procedure: Right Achilles Reconstruction;  Surgeon: Nadara MustardMarcus Duda V, MD;  [facility];  Service: Orthopedics;  Laterality: Right;  . NO PAST SURGERIES    . sleeve surgery     Allergies  Allergen Reactions  . Penicillins Hives and Itching    Has patient had a PCN reaction causing immediate rash, facial/tongue/throat swelling, SOB or lightheadedness with hypotension: Yes Has patient had a PCN reaction causing severe rash involving mucus membranes or skin necrosis: No Has patient had a PCN reaction that required hospitalization No Has patient had a PCN reaction occurring within the last 10 years: Yes If all of the above answers are "NO", then may proceed with Cephalosporin use.    No current facility-administered medications on file prior to encounter.    Current Outpatient Medications on File Prior to Encounter  Medication Sig Dispense Refill  . JUNEL 1/20 1-20 MG-MCG tablet Take 1 tablet by mouth daily.  11   Social History   Socioeconomic History  . Marital status: Single    Spouse name: Not on file  . Number of children: Not on file  . Years of education: Not on file  .  Highest education level: Not on file  Occupational History  . Not on file  Social Needs  . Financial resource strain: Not on file  . Food insecurity:    Worry: Not on file    Inability: Not on file  . Transportation needs:    Medical: Not on file    Non-medical: Not on file  Tobacco Use  . Smoking status: Never Smoker  . Smokeless tobacco: Never Used  Substance and Sexual Activity  . Alcohol use: Yes    Comment: occasional  . Drug use: No  . Sexual activity: Not on file  Lifestyle  . Physical activity:    Days per week: Not on file    Minutes per session: Not on file  . Stress: Not on file  Relationships  . Social connections:    Talks on phone: Not on file    Gets together: Not on file    Attends religious service: Not on file    Active member of club or organization: Not on file    Attends meetings of clubs or organizations: Not on file    Relationship status: Not on file  . Intimate partner violence:    Fear of current or ex partner: Not on file    Emotionally abused: Not on file    Physically abused: Not on file    Forced sexual activity: Not on file  Other Topics Concern  . Not on file  Social History  Narrative  . Not on file   Family History  Problem Relation Age of Onset  . Lung cancer Mother     OBJECTIVE:  Vitals:   02/08/18 1135  BP: 120/70  Pulse: 80  Resp: 20  Temp: 98.5 F (36.9 C)  TempSrc: Oral  SpO2: 100%    General appearance: AOx3; in no acute distress.  Head: NCAT Lungs: CTA bilaterally Heart: RRR.  Clear S1 and S2 without murmur, gallops, or rubs.  Radial pulses 2+ bilaterally. Musculoskeletal: Left foot Inspection: Skin warm, dry, clear and intact without obvious erythema, effusion, or ecchymosis. Second toe with hammertoe deformity Palpation: Nontender to palpation ROM: FROM active about the 1st and 2nd toe Strength: 5/5 knee abduction, 5/5 knee adduction, 5/5 knee flexion, 5/5 knee extension Decreased strength of great toe  with dorsiflexion and plantar flexion Proprioception intact Skin: warm and dry Neurologic: Ambulates without difficulty; walks on heels and tip toes without obvious deformity; Sensation decreased over left great toe and second toe Psychological: alert and cooperative; normal mood and affect  ASSESSMENT & PLAN:  1. Weakness of foot, left    Discussed patient case with Dr. Tracie Harrier.  There does not appear to be an immediate threat to life at this time.  Recommend conservative management and follow up with orthopedist.  Given strict return and ER precautions if symptoms do not improve, if symptoms worsen, or new symptoms occur.    Meds ordered this encounter  Medications  . naproxen (NAPROSYN) 375 MG tablet    Sig: Take 1 tablet (375 mg total) by mouth 2 (two) times daily.    Dispense:  20 tablet    Refill:  0    Order Specific Question:   Supervising Provider    Answer:   Isa Rankin [[id number]]   Continue conservative management of rest, ice, elevation Take naproxen as needed (may cause abdominal discomfort, ulcers, and GI bleeds avoid taking with other NSAIDs) Follow up with orthopedist for further evaluation and management of symptoms Return or go to the ER if you have any new or worsening symptoms (such as slurred speech, vision changes, increasing or spreading weakness, increasing sensation changes, numbness or tingling, drop foot, facial droop, walking difficulties etc..)    Reviewed expectations re: course of current medical issues. Questions answered. Outlined signs and symptoms indicating need for more acute intervention. Patient verbalized understanding. After Visit Summary given.    Rennis Harding, PA-C 02/08/18 1258

## 2018-02-08 NOTE — Discharge Instructions (Addendum)
Continue conservative management of rest, ice, elevation Take naproxen as needed (may cause abdominal discomfort, ulcers, and GI bleeds avoid taking with other NSAIDs) Follow up with orthopedist for further evaluation and management of symptoms Return or go to the ER if you have any new or worsening symptoms (such as slurred speech, vision changes, increasing or spreading weakness, increasing sensation changes, numbness or tingling, drop foot, facial droop, walking difficulties etc..)

## 2018-03-03 ENCOUNTER — Emergency Department (HOSPITAL_COMMUNITY)
Admission: EM | Admit: 2018-03-03 | Discharge: 2018-03-03 | Disposition: A | Discharge status: Home / Self Care | Payer: BLUE CROSS/BLUE SHIELD | Attending: Emergency Medicine | Admitting: Emergency Medicine

## 2018-03-03 ENCOUNTER — Emergency Department (HOSPITAL_COMMUNITY): Payer: BLUE CROSS/BLUE SHIELD

## 2018-03-03 ENCOUNTER — Encounter (HOSPITAL_COMMUNITY): Payer: Self-pay | Admitting: Emergency Medicine

## 2018-03-03 DIAGNOSIS — Z79899 Other long term (current) drug therapy: Secondary | ICD-10-CM | POA: Diagnosis not present

## 2018-03-03 DIAGNOSIS — R29898 Other symptoms and signs involving the musculoskeletal system: Secondary | ICD-10-CM

## 2018-03-03 DIAGNOSIS — E119 Type 2 diabetes mellitus without complications: Secondary | ICD-10-CM | POA: Diagnosis not present

## 2018-03-03 DIAGNOSIS — E538 Deficiency of other specified B group vitamins: Secondary | ICD-10-CM | POA: Diagnosis not present

## 2018-03-03 DIAGNOSIS — N3000 Acute cystitis without hematuria: Secondary | ICD-10-CM | POA: Insufficient documentation

## 2018-03-03 DIAGNOSIS — M6281 Muscle weakness (generalized): Secondary | ICD-10-CM | POA: Diagnosis not present

## 2018-03-03 DIAGNOSIS — R1084 Generalized abdominal pain: Secondary | ICD-10-CM | POA: Diagnosis present

## 2018-03-03 LAB — I-STAT BETA HCG BLOOD, ED (MC, WL, AP ONLY): I-stat hCG, quantitative: <5 m[IU]/mL (ref <5)

## 2018-03-03 LAB — COMPREHENSIVE METABOLIC PANEL
ALBUMIN: 3.8 g/dL (ref 3.5–5.0)
ALK PHOS: 78 U/L (ref 38–126)
ALT: 14 U/L (ref 0–44)
ANION GAP: 10 (ref 5–15)
AST: 19 U/L (ref 15–41)
BUN: 23 mg/dL — ABNORMAL HIGH (ref 6–20)
CALCIUM: 9.4 mg/dL (ref 8.9–10.3)
CHLORIDE: 111 mmol/L (ref 98–111)
CO2: 23 mmol/L (ref 22–32)
Creatinine, Ser: 1.09 mg/dL — ABNORMAL HIGH (ref 0.44–1.00)
GFR calc Af Amer: >60 mL/min (ref >60)
GFR calc non Af Amer: 59 mL/min — ABNORMAL LOW (ref >60)
GLUCOSE: 101 mg/dL — AB (ref 70–99)
Potassium: 3.7 mmol/L (ref 3.5–5.1)
SODIUM: 144 mmol/L (ref 135–145)
Total Bilirubin: 0.5 mg/dL (ref 0.3–1.2)
Total Protein: 8.3 g/dL — ABNORMAL HIGH (ref 6.5–8.1)

## 2018-03-03 LAB — URINALYSIS, ROUTINE W REFLEX MICROSCOPIC
Bilirubin Urine: NEGATIVE
GLUCOSE, UA: NEGATIVE mg/dL
Ketones, ur: NEGATIVE mg/dL
Nitrite: POSITIVE — AB
PH: 6 (ref 5.0–8.0)
PROTEIN: NEGATIVE mg/dL
Specific Gravity, Urine: >1.03 — ABNORMAL HIGH (ref 1.005–1.030)

## 2018-03-03 LAB — URINALYSIS, MICROSCOPIC (REFLEX)

## 2018-03-03 LAB — CBC
HEMATOCRIT: 35.4 % — AB (ref 36.0–46.0)
Hemoglobin: 11.7 g/dL — ABNORMAL LOW (ref 12.0–15.0)
MCH: 29 pg (ref 26.0–34.0)
MCHC: 33.1 g/dL (ref 30.0–36.0)
MCV: 87.6 fL (ref 78.0–100.0)
PLATELETS: 408 10*3/uL — AB (ref 150–400)
RBC: 4.04 MIL/uL (ref 3.87–5.11)
RDW: 13.6 % (ref 11.5–15.5)
WBC: 6.5 10*3/uL (ref 4.0–10.5)

## 2018-03-03 LAB — IRON AND TIBC
IRON: 39 ug/dL (ref 28–170)
Saturation Ratios: 14 % (ref 10.4–31.8)
TIBC: 270 ug/dL (ref 250–450)
UIBC: 231 ug/dL

## 2018-03-03 LAB — FOLATE: Folate: 4.1 ng/mL — ABNORMAL LOW (ref >5.9)

## 2018-03-03 LAB — FERRITIN: FERRITIN: 91 ng/mL (ref 11–307)

## 2018-03-03 LAB — IRON: Iron: 39 ug/dL (ref 28–170)

## 2018-03-03 LAB — VITAMIN B12: Vitamin B-12: 232 pg/mL (ref 180–914)

## 2018-03-03 MED ORDER — NEPHROCAPS 1 MG PO CAPS: 1.0000 | ORAL_CAPSULE | Freq: Every day | ORAL | 0 refills | Status: AC

## 2018-03-03 MED ORDER — FOLIC ACID 1 MG PO TABS
1.0000 mg | ORAL_TABLET | Freq: Once | ORAL | Status: AC
  Administered 2018-03-03: 1 mg via ORAL
  Filled 2018-03-03: qty 1

## 2018-03-03 MED ORDER — GADOBENATE DIMEGLUMINE 529 MG/ML IV SOLN
20.0000 mL | Freq: Once | INTRAVENOUS | Status: AC | PRN
  Administered 2018-03-03: 17 mL via INTRAVENOUS

## 2018-03-03 MED ORDER — IOPAMIDOL (ISOVUE-300) INJECTION 61%
100.0000 mL | Freq: Once | INTRAVENOUS | Status: AC | PRN
  Administered 2018-03-03: 100 mL via INTRAVENOUS

## 2018-03-03 MED ORDER — CIPROFLOXACIN HCL 500 MG PO TABS: 500.0000 mg | ORAL_TABLET | Freq: Two times a day (BID) | ORAL | 0 refills | Status: AC

## 2018-03-03 MED ORDER — CIPROFLOXACIN HCL 500 MG PO TABS
500.0000 mg | ORAL_TABLET | Freq: Once | ORAL | Status: AC
  Administered 2018-03-03: 500 mg via ORAL
  Filled 2018-03-03: qty 1

## 2018-03-03 MED ORDER — IOPAMIDOL (ISOVUE-300) INJECTION 61%
INTRAVENOUS | Status: AC
  Administered 2018-03-03: 13:00:00
  Filled 2018-03-03: qty 100

## 2018-03-03 NOTE — ED Provider Notes (Signed)
[facility]-EMERGENCY DEPT Provider Note   CSN: [id number] Arrival date & time: 03/03/18  7829     History   Chief Complaint Chief Complaint  Patient presents with  . Flank Pain  . Foot Pain    HPI [name] is a 47 y.o. female.  Patient presents to the ED with left-sided flank pain, left foot weakness, chronic right foot pain.  Patient has history of gastric sleeve about 8 months ago.  Has had left flank pain for about 1 month on and off that is worse with movement.  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  Patient has also noticed over the past 6 weeks of weakness in the left foot with having to pick up her left leg more than normal when she walks.  Does not keep a strict diet.  Denies any trauma.  No recent falls.  No history of strokes.  Patient has chronic right heel pain following Achilles surgery.  Patient denies any fever, chills, loss of bowel or bladder.  The history is provided by the patient.  Illness  This is a chronic problem. The current episode started more than 1 week ago. The problem occurs daily. The problem has been gradually worsening. Pertinent negatives include no chest pain, no abdominal pain, no headaches and no shortness of breath. The symptoms are aggravated by walking, bending and twisting. Nothing relieves the symptoms. She has tried nothing for the symptoms. The treatment provided no relief.    Past Medical History:  Diagnosis Date  . Anemia    as a teenager  . Diabetes mellitus without complication [facility])     Patient Active Problem List   Diagnosis Date Noted  . Achilles tendon tear, right, sequela 05/28/2016    Past Surgical History:  Procedure Laterality Date  . ACHILLES TENDON SURGERY Right 12/02/2015   Procedure: Right Achilles Reconstruction;  Surgeon: Nadara Mustard, MD;  Location: [facility] OR;  Service: Orthopedics;  Laterality: Right;  . NO PAST SURGERIES    . sleeve surgery        OB History   None      Home Medications    Prior to Admission medications   Medication Sig Start Date End Date Taking? Authorizing Provider  Aspirin-Salicylamide-Caffeine (BC HEADACHE PO) Take 2 packets by mouth daily as needed (headache).   Yes [provider]  JUNEL 1/20 1-20 MG-MCG tablet Take 1 tablet by mouth daily. 10/27/15  Yes [provider]  b complex-C-folic acid 1 MG capsule Take 1 capsule (1 mg total) by mouth daily. 03/03/18 04/02/18  Treg Diemer, DO  ciprofloxacin (CIPRO) 500 MG tablet Take 1 tablet (500 mg total) by mouth 2 (two) times daily for 5 days. 03/03/18 03/08/18  Saliou Barnier, DO  naproxen (NAPROSYN) 375 MG tablet Take 1 tablet (375 mg total) by mouth 2 (two) times daily. Patient not taking: Reported on 03/03/2018 02/08/18   Rennis Harding, PA-C    Family History Family History  Problem Relation Age of Onset  . Lung cancer Mother     Social History Social History   Tobacco Use  . Smoking status: Never Smoker  . Smokeless tobacco: Never Used  Substance Use Topics  . Alcohol use: Yes    Comment: occasional  . Drug use: No     Allergies   Penicillins   Review of Systems Review of Systems  Constitutional: Negative for chills and fever.  HENT: Negative for ear  pain and sore throat.   Eyes: Negative for pain and visual disturbance.  Respiratory: Negative for cough and shortness of breath.   Cardiovascular: Negative for chest pain and palpitations.  Gastrointestinal: Negative for abdominal pain and vomiting.  Genitourinary: Negative for dysuria and hematuria.  Musculoskeletal: Positive for gait problem. Negative for arthralgias and back pain.  Skin: Negative for color change, rash and wound.  Neurological: Positive for weakness. Negative for dizziness, seizures, syncope, facial asymmetry, speech difficulty, light-headedness, numbness and headaches.  All other systems reviewed and are negative.    Physical Exam Updated  Vital Signs  ED Triage Vitals [03/03/18 0846]  Enc Vitals Group     BP 134/85     Pulse Rate 82     Resp 16     Temp 97.7 F (36.5 C)     Temp Source Oral     SpO2 100 %     Weight 187 lb (84.8 kg)     Height 5\' 9"  (1.753 m)     Head Circumference      Peak Flow      Pain Score 2     Pain Loc      Pain Edu?      Excl. in GC?     Physical Exam  Constitutional: She is oriented to person, place, and time. She appears well-developed and well-nourished. No distress.  HENT:  Head: Normocephalic and atraumatic.  Mouth/Throat: No oropharyngeal exudate.  Eyes: Pupils are equal, round, and reactive to light. Conjunctivae and EOM are normal.  Neck: Normal range of motion. Neck supple.  Cardiovascular: Normal rate, regular rhythm, normal heart sounds and intact distal pulses.  No murmur heard. Pulmonary/Chest: Effort normal and breath sounds normal. No respiratory distress.  Abdominal: Soft. There is no tenderness.  Musculoskeletal: She exhibits tenderness (TTP to paraspinal muscles in thoracic region and lumbar region of spine on R and L side, no midline spinal tenderness). She exhibits no edema.  Neurological: She is alert and oriented to person, place, and time. A cranial nerve deficit is present. She exhibits normal muscle tone. Coordination normal.  Patient with 5 out of 5 strength in the right lower extremity, 4+ out of 5 strength in dorsiflexion and left lower extremity otherwise 5+5 strength in left lower extremity.  5+ out of 5 strength in bilateral upper extremities, mildly decreased sensation over top of the left foot but otherwise normal sensation throughout.  Mild foot drop on gait analysis  Skin: Skin is warm and dry. Capillary refill takes less than 2 seconds.  Psychiatric: She has a normal mood and affect.  Nursing note and vitals reviewed.    ED Treatments / Results  Labs (all labs ordered are listed, but only abnormal results are displayed) Labs Reviewed  CBC -  Abnormal; Notable for the following components:      Result Value   Hemoglobin 11.7 (*)    HCT 35.4 (*)    Platelets 408 (*)    All other components within normal limits  URINALYSIS, ROUTINE W REFLEX MICROSCOPIC - Abnormal; Notable for the following components:   APPearance CLOUDY (*)    Specific Gravity, Urine >1.030 (*)    Hgb urine dipstick TRACE (*)    Nitrite POSITIVE (*)    Leukocytes, UA MODERATE (*)    All other components within normal limits  COMPREHENSIVE METABOLIC PANEL - Abnormal; Notable for the following components:   Glucose, Bld 101 (*)    BUN 23 (*)    Creatinine,  Ser 1.09 (*)    Total Protein 8.3 (*)    GFR calc non Af Amer 59 (*)    All other components within normal limits  FOLATE - Abnormal; Notable for the following components:   Folate 4.1 (*)    All other components within normal limits  URINALYSIS, MICROSCOPIC (REFLEX) - Abnormal; Notable for the following components:   Bacteria, UA MANY (*)    All other components within normal limits  URINE CULTURE  VITAMIN B12  IRON  IRON AND TIBC  FERRITIN  VITAMIN E  VITAMIN D 25 HYDROXY (VIT D DEFICIENCY, FRACTURES)  I-STAT BETA HCG BLOOD, ED ([facility], [facility], [facility] ONLY)  I-STAT BETA HCG BLOOD, ED ([facility], [facility], [facility] ONLY)    EKG None  Radiology Dg Lumbar Spine 2-3 Views  Result Date: 03/03/2018 CLINICAL DATA:  Left-sided flank pain for the past months made worse with standing or changes in position. Occasional left foot pain. EXAM: LUMBAR SPINE - 2-3 VIEW COMPARISON:  None. FINDINGS: The lumbar vertebral bodies are preserved in height. The pedicles and transverse processes are intact. The disc space heights are well maintained. There are anterior bridging and near bridging osteophytes in the lower thoracic spine and at L2-3, L3-4, and L4-5. There is no spondylolisthesis. There is no significant facet joint hypertrophy. IMPRESSION: Mild degenerative endplate spurring at multiple levels. No compression fracture, high-grade disc  space narrowing, or spondylolisthesis. Electronically Signed   By: David  [place] M.D.   On: 03/03/2018 10:18   Mr Lumbar Spine W Wo Contrast  Result Date: 03/03/2018 CLINICAL DATA:  Left-sided flank pain over the last month, worse with standing. Occasional left foot pain. EXAM: MRI LUMBAR SPINE WITHOUT AND WITH CONTRAST TECHNIQUE: Multiplanar and multiecho pulse sequences of the lumbar spine were obtained without and with intravenous contrast. CONTRAST:  17mL MULTIHANCE GADOBENATE DIMEGLUMINE 529 MG/ML IV SOLN COMPARISON:  CT same day.  Lumbar radiographs same day. FINDINGS: Segmentation:  5 lumbar type vertebral bodies. Alignment: 3 mm anterolisthesis L5-S1 due to chronic facet arthropathy. Vertebrae:  No fracture or primary bone lesion. Conus medullaris and cauda equina: Conus extends to the L1 level. Conus and cauda equina appear normal. Paraspinal and other soft tissues: Simple appearing renal cysts. Gallstones. Disc levels: Ordinary anterior osteophytes at T11-12 and T12-L1. no disc degeneration or posterior disc bulge or herniation. Normal appearance of the discs at L1-2, L2-3 and L3-4. No posterior element pathology. L4-5: Bilateral facet osteoarthritis with mild edema and enhancement. No disc degeneration, bulge or herniation. No stenosis. The facet arthropathy could be a cause of low back pain or referred facet syndrome pain. L5-S1: Chronic facet arthropathy, with apparent fusion of the facets on the right. 3 mm of chronic anterolisthesis at this level, probably fixed. No disc herniation or stenosis. IMPRESSION: L4-5: Bilateral facet arthropathy with edema and mild enhancement. This could be a cause of low back pain and or referred facet syndrome pain. L5-S1: Chronic facet arthropathy, apparently with fusion at least on the right and probably on the left. No stenosis at this level. Electronically Signed   By: Paulina Fusi M.D.   On: 03/03/2018 14:13   Ct Abdomen Pelvis W Contrast  Result Date:  03/03/2018 CLINICAL DATA:  Left-sided flank pain for 1 month. EXAM: CT ABDOMEN AND PELVIS WITH CONTRAST TECHNIQUE: Multidetector CT imaging of the abdomen and pelvis was performed using the standard protocol following bolus administration of intravenous contrast. CONTRAST:  ISOVUE-300 IOPAMIDOL (ISOVUE-300) INJECTION 61% COMPARISON:  None. FINDINGS: Lower chest:  No acute abnormality. Hepatobiliary: Cholelithiasis is noted without inflammation. No biliary dilatation is noted. The liver is unremarkable. Pancreas: Unremarkable. No pancreatic ductal dilatation or surrounding inflammatory changes. Spleen: Normal in size without focal abnormality. Adrenals/Urinary Tract: Adrenal glands appear normal. Bilateral renal cysts are noted. No hydronephrosis or renal obstruction is noted. No renal or ureteral calculi are noted. Urinary bladder is unremarkable. Stomach/Bowel: Status post gastric surgery. The appendix appears normal. There is no evidence of bowel obstruction or inflammation. Sigmoid diverticulosis is noted without inflammation. Vascular/Lymphatic: No significant vascular findings are present. No enlarged abdominal or pelvic lymph nodes. Reproductive: Uterus and bilateral adnexa are unremarkable. Other: No abdominal wall hernia or abnormality. No abdominopelvic ascites. Musculoskeletal: No acute or significant osseous findings. IMPRESSION: Cholelithiasis without inflammation. Sigmoid diverticulosis without inflammation. No acute abnormality seen in the abdomen or pelvis. Electronically Signed   By: Lupita RaiderJames  Green Jr, M.D.   On: 03/03/2018 12:20    Procedures Procedures (including critical care time)  Medications Ordered in ED Medications  iopamidol (ISOVUE-300) 61 % injection (  Contrast Given 03/03/18 1252)  iopamidol (ISOVUE-300) 61 % injection 100 mL (100 mLs Intravenous Contrast Given 03/03/18 1158)  ciprofloxacin (CIPRO) tablet 500 mg (500 mg Oral Given 03/03/18 1220)  gadobenate dimeglumine  (MULTIHANCE) injection 20 mL (17 mLs Intravenous Contrast Given 03/03/18 1339)  folic acid (FOLVITE) tablet 1 mg (1 mg Oral Given 03/03/18 1437)     Initial Impression / Assessment and Plan / ED Course  I have reviewed the triage vital signs and the nursing notes.  Pertinent labs & imaging results that were available during my care of the patient were reviewed by me and considered in my medical decision making (see chart for details).     Melinda Cross is a 47 year old female history of diabetes who presents to the ED with left foot drop left CVA pain.  Patient with normal vitals.  No fever.  Patient states that left flank pain is been ongoing for several weeks to months and that weakness in the left foot has occurred for over 1 month if not longer.  Patient has history of gastric sleeve.  Patient denies any trauma, no midline back pain.  Has some dysuria.  Patient with weakness in the left foot and mostly dorsiflexion has some sensation changes over the left foot compared to the other side.  Patient has some foot drop when walking.  However strength is normal elsewhere and patient also with normal sensation.  No concern for stroke.  Patient with some mild tenderness in the left CVA.  No abdominal pain on exam.  Overall is well-appearing.  Patient denies any loss of bowel or bladder, saddle anesthesia.    Lumbar x-ray showed no acute malalignment or fracture and lumbar MRI showed no acute spinal cord involvement, fracturesm to cause foot drop.  Patient with low folic acid.  Talked with Dr. Jerrell BelfastAurora with neurology about foot drop and possibly vitamin deficiency versus peripheral neuropathy/process.  Patient placed and foot orthotic to help with ambulation.  Patient was given folic acid in the ED and given multivitamin prescription. Will f/u outpt with neuro for likely nerve conduction studies.  Patient also found to have urinary tract infection and treated with ciprofloxacin.  Patient otherwise no  significant electrolyte abnormality, kidney injury, anemia.  CT of abdomen and pelvis did not show any acute abnormality.  No obstruction, no kidney stone.  Suspect patient might have pain secondary to urinary tract infection.  Patient given prescription for Cipro.  Patient given information to follow-up with primary care doctor, neurology for likely conductive nerve studies.  Told to return to ED if symptoms worsen.  Discharged from ED in good condition.  This chart was dictated using voice recognition software.  Despite best efforts to proofread,  errors can occur which can change the documentation meaning.   Final Clinical Impressions(s) / ED Diagnoses   Final diagnoses:  Acute cystitis without hematuria  Weakness of foot, left  Folate deficiency    ED Discharge Orders         Ordered    ciprofloxacin (CIPRO) 500 MG tablet  2 times daily     03/03/18 1441    b complex-C-folic acid 1 MG capsule  Daily     03/03/18 1443           Virgina Norfolk, DO 03/03/18 1546

## 2018-03-03 NOTE — ED Notes (Signed)
Patient transported to CT 

## 2018-03-03 NOTE — ED Triage Notes (Addendum)
Pt states  She has had left sided flank pain x 1 month. Pt states it is worse with standing up and positional changes. Pt also states that she is having left foot pain occasionally, where she cannot bend her toes. Pt also states that she had recent achilles surgery on her right leg, and c/o spurs.

## 2018-03-03 NOTE — Discharge Instructions (Signed)
Please follow-up with neurology, call the wellness number to set up primary care, take your antibiotic in your multivitamin daily

## 2018-03-03 NOTE — ED Notes (Signed)
Pt ambulatory to restroom

## 2018-03-04 LAB — VITAMIN D 25 HYDROXY (VIT D DEFICIENCY, FRACTURES): Vit D, 25-Hydroxy: 26.1 ng/mL — ABNORMAL LOW (ref 30.0–100.0)

## 2018-03-05 LAB — VITAMIN E
VITAMIN E(GAMMA TOCOPHEROL): 1.7 mg/L (ref 0.5–5.5)
Vitamin E (Alpha Tocopherol): 14.8 mg/L (ref 7.0–25.1)

## 2018-03-05 LAB — URINE CULTURE: Culture: >=100000 — AB

## 2018-03-14 ENCOUNTER — Ambulatory Visit: Payer: BLUE CROSS/BLUE SHIELD | Admitting: Neurology

## 2018-03-14 ENCOUNTER — Other Ambulatory Visit: Payer: Self-pay

## 2018-03-14 ENCOUNTER — Encounter: Payer: Self-pay | Admitting: Neurology

## 2018-03-14 VITALS — BP 110/80 | HR 88 | Ht 69.0 in | Wt 190.5 lb

## 2018-03-14 DIAGNOSIS — M21372 Foot drop, left foot: Secondary | ICD-10-CM

## 2018-03-14 NOTE — Progress Notes (Signed)
Reason for visit: Left foot drop  Referring physician: [place]  Melinda Cross is a 47 y.o. female  History of present illness:  Melinda Cross is a 47 year old right-handed black female with a history of a painless onset of left foot drop.  The patient first noticed it when she tried to wiggle her toes 1 month ago.  She has noted some tingling sensations when she touches the dorsum of the left foot.  The patient has had some low back pain as well on the left side.  The patient within the last week has noted some slight tingling sensations of the left arm, no weakness is noted on the arm, and she has not had any sensation changes on the face.  The patient denies headaches, dizziness, slurred speech or difficulty swallowing.  She has not had any vision changes.  She denies issues controlling the bowels or the bladder.  She denies neck pain or pain down the arms or any pain radiating from the back down the legs.  The patient was seen through the emergency room on 03 March 2018.  MRI of the lumbar spine is done and did not show evidence of nerve root impingement.  The patient is sent to this office for an evaluation.  Past Medical History:  Diagnosis Date  . Anemia    as a teenager  . Diabetes mellitus without complication [facility])     Past Surgical History:  Procedure Laterality Date  . ACHILLES TENDON SURGERY Right 12/02/2015   Procedure: Right Achilles Reconstruction;  Surgeon: Nadara Mustard, MD;  Location: [facility] OR;  Service: Orthopedics;  Laterality: Right;  . sleeve surgery      Family History  Problem Relation Age of Onset  . Lung cancer Mother     Social history:  reports that she has never smoked. She has never used smokeless tobacco. She reports that she drinks alcohol. She reports that she does not use drugs.  Medications:  Prior to Admission medications   Medication Sig Start Date End Date Taking? Authorizing Provider  JUNEL 1/20 1-20 MG-MCG tablet Take 1 tablet by mouth daily.  10/27/15  Yes [provider]  b complex-C-folic acid 1 MG capsule Take 1 capsule (1 mg total) by mouth daily. Patient not taking: Reported on 03/14/2018 03/03/18 04/02/18  Virgina Norfolk, DO      Allergies  Allergen Reactions  . Penicillins Hives, Itching and Swelling    Has patient had a PCN reaction causing immediate rash, facial/tongue/throat swelling, SOB or lightheadedness with hypotension: Yes Has patient had a PCN reaction causing severe rash involving mucus membranes or skin necrosis: No Has patient had a PCN reaction that required hospitalization No Has patient had a PCN reaction occurring within the last 10 years: Yes If all of the above answers are "NO", then may proceed with Cephalosporin use.     ROS:  Out of a complete 14 system review of symptoms, the patient complains only of the following symptoms, and all other reviewed systems are negative.  Weight loss Numbness, weakness  Blood pressure 110/80, pulse 88, height 5\' 9"  (1.753 m), weight 190 lb 8 oz (86.4 kg).  Physical Exam  General: The patient is alert and cooperative at the time of the examination.  Eyes: Pupils are equal, round, and reactive to light. Discs are flat bilaterally.  Neck: The neck is supple, no carotid bruits are noted.  Respiratory: The respiratory examination is clear.  Cardiovascular: The cardiovascular examination reveals a  regular rate and rhythm, no obvious murmurs or rubs are noted.  Skin: Extremities are without significant edema.  Neurologic Exam  Mental status: The patient is alert and oriented x 3 at the time of the examination. The patient has apparent normal recent and remote memory, with an apparently normal attention span and concentration ability.  Cranial nerves: Facial symmetry is present. There is good sensation of the face to pinprick and soft touch bilaterally. The strength of the facial muscles and the muscles to head turning and shoulder shrug are normal  bilaterally. Speech is well enunciated, no aphasia or dysarthria is noted. Extraocular movements are full. Visual fields are full. The tongue is midline, and the patient has symmetric elevation of the soft palate. No obvious hearing deficits are noted.  Motor: The motor testing reveals 5 over 5 strength of all 4 extremities, with exception of a prominent foot drop on the left, weakness with eversion of the foot, not inversion. Good symmetric motor tone is noted throughout.  Sensory: Sensory testing is intact to pinprick, soft touch, vibration sensation, and position sense on all 4 extremities, with exception that there is decreased pinprick sensation on the dorsum of the left foot and left lower leg, decreased vibration sensation on the left foot.  There appears to be some extinction with the left leg.  Coordination: Cerebellar testing reveals good finger-nose-finger and heel-to-shin bilaterally.  Gait and station: Gait is associated with a steppage gait pattern on the left. Tandem gait is normal. Romberg is negative. No drift is seen.  Reflexes: Deep tendon reflexes are symmetric and normal bilaterally. Toes are downgoing bilaterally.   MRI lumbar 03/03/18:  IMPRESSION: L4-5: Bilateral facet arthropathy with edema and mild enhancement. This could be a cause of low back pain and or referred facet syndrome pain.  L5-S1: Chronic facet arthropathy, apparently with fusion at least on the right and probably on the left. No stenosis at this level.  * MRI scan images were reviewed online. I agree with the written report.    Assessment/Plan:  1.  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  The patient does have a habit of crossing her legs left over right.  She is to stop doing this, we will set her up for nerve conduction studies of both legs and EMG on the left leg.  The patient has reported some slight  tingling sensations on the left arm as well, if the EMG does not confirm a peripheral nerve injury MRI of the brain and cervical spine will be done to exclude demyelinating disease.  Marlan Palau MD 03/14/2018 9:54 AM  [facility] [address] [place], [place] [address]  Phone [phone] Fax [phone]

## 2018-03-27 ENCOUNTER — Other Ambulatory Visit: Payer: Self-pay

## 2018-03-27 ENCOUNTER — Ambulatory Visit: Payer: BLUE CROSS/BLUE SHIELD | Attending: Family Medicine | Admitting: Family Medicine

## 2018-03-27 ENCOUNTER — Encounter: Payer: Self-pay | Admitting: Family Medicine

## 2018-03-27 VITALS — BP 119/83 | HR 75 | Temp 98.6°F | Resp 18 | Ht 69.0 in | Wt 193.6 lb

## 2018-03-27 DIAGNOSIS — Z880 Allergy status to penicillin: Secondary | ICD-10-CM | POA: Insufficient documentation

## 2018-03-27 DIAGNOSIS — D649 Anemia, unspecified: Secondary | ICD-10-CM

## 2018-03-27 DIAGNOSIS — Z6828 Body mass index (BMI) 28.0-28.9, adult: Secondary | ICD-10-CM | POA: Insufficient documentation

## 2018-03-27 DIAGNOSIS — E119 Type 2 diabetes mellitus without complications: Secondary | ICD-10-CM | POA: Insufficient documentation

## 2018-03-27 DIAGNOSIS — Z2821 Immunization not carried out because of patient refusal: Secondary | ICD-10-CM | POA: Insufficient documentation

## 2018-03-27 DIAGNOSIS — E669 Obesity, unspecified: Secondary | ICD-10-CM | POA: Insufficient documentation

## 2018-03-27 DIAGNOSIS — M21372 Foot drop, left foot: Secondary | ICD-10-CM | POA: Diagnosis not present

## 2018-03-27 DIAGNOSIS — Z9884 Bariatric surgery status: Secondary | ICD-10-CM | POA: Insufficient documentation

## 2018-03-27 NOTE — Progress Notes (Signed)
[facility] paperwork for work  Flu shot: no  Pain: 0  Left foot

## 2018-03-27 NOTE — Progress Notes (Signed)
Subjective:    Patient ID: Melinda Cross, female    DOB: 09/16/1970, 47 y.o.   MRN: [id number]  HPI 47 yo female new to the practice. Patient with complaint of needing FMLA paperwork completed at today's visit as patient states that the paperwork is due before tomorrow.  Patient reports that she had weight loss surgery, gastric sleeve done in December of last year.  Patient states that over the past several months, patient noticed onset of numbness in her left leg as well as weakness and some lower back pain.  Patient states she then noticed that when she is walking, her left foot would not lift like it was supposed to.  Patient states that she saw a neurologist on 03/14/2018 who told her that her foot drop might be related to a nerve injury.  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  Patient states that the neurologist told her that this could have caused nerve damage resulting in her left foot weakness/foot drop.  Patient also states that the prolonged sitting required by her job also causes her to have low back pain.  Patient states that she needs more frequent breaks and breaks for longer time periods and sometimes need to modify the start of her workday.  Patient also asked to follow-up with her neurosurgeon.  Patient denies any abdominal pain, no nausea.  Patient however does state that the loose skin on her lower abdomen as well as lose skin around her breasts sometimes becomes irritated and uncomfortable.  Patient states that she sometimes gets irritation of the skin in these areas.      Patient reports that she does not smoke or drink other than occasional social alcohol use a few times per year.  Patient is currently single.  Patient works in Product/process development scientistcomputer tech support.  Patient has no past medical history other than obesity and heel  spurs.  Patient reports past surgical history of gastric sleeve in December 2018 and patient reports that she had to have repair of her right Achilles tendon as she stepped off of a curb on and a heel spur punctured and ruptured her Achilles tendon as the heel spur broke off.  Patient reports a family history significant for maternal grandmother with breast cancer.  Patient reports  maternal grandfather and mother have had lung cancer.  Patient reports that she has had some fatigue.  Patient is taking an over-the-counter prenatal multivitamin.  Patient states that she was told to take a prescription vitamin with folate but she has not yet started this medication.  Patient denies any abdominal pain, no blood in the stool and no dark stools.  Patient does have some numbness/tingling at times in her left foot. Past Medical History:  Diagnosis Date  . Anemia    as a teenager  . Diabetes mellitus without complication [facility](HCC)    Past Surgical History:  Procedure Laterality Date  . ACHILLES TENDON SURGERY Right 12/02/2015   Procedure: Right Achilles Reconstruction;  Surgeon: Nadara MustardMarcus Duda V, MD;  [facility];  Service: Orthopedics;  Laterality: Right;  . sleeve surgery     Family History  Problem Relation Age of Onset  . Lung cancer Mother    Social History   Tobacco Use  . Smoking status: Never Smoker  . Smokeless tobacco: Never Used  Substance Use Topics  . Alcohol use: Not Currently    Comment: occasional  . Drug use: No   Allergies  Allergen Reactions  . Penicillins Hives, Itching and Swelling    Has patient had a PCN reaction causing immediate rash, facial/tongue/throat swelling, SOB or lightheadedness with hypotension: Yes Has patient had a PCN reaction causing severe rash involving mucus membranes or skin necrosis: No Has patient had a PCN reaction that required hospitalization No Has patient had a PCN reaction occurring within the last 10 years: Yes If all of the above answers are  "NO", then may proceed with Cephalosporin use.     Review of Systems  Constitutional: Positive for fatigue. Negative for chills and fever.  Respiratory: Negative for cough and shortness of breath.   Cardiovascular: Negative for chest pain, palpitations and leg swelling.  Gastrointestinal: Negative for abdominal pain and blood in stool.  Genitourinary: Negative for dysuria and frequency.  Musculoskeletal: Positive for back pain and gait problem.  Neurological: Positive for weakness and numbness. Negative for dizziness and headaches.       Objective:   Physical Exam BP 119/83   Pulse 75   Temp 98.6 F (37 C) (Oral)   Resp 18   Ht 5\' 9"  (1.753 m)   Wt 193 lb 9.6 oz (87.8 kg)   LMP 03/13/2018 (Approximate)   SpO2 97%   BMI 28.59 kg/m  Nurse's notes and vital signs reviewed General-well-nourished, well-developed overweight female in no acute distress Neck supple, no lymphadenopathy, no thyromegaly Lungs-clear to auscultation bilaterally Cardiovascular-regular rate and rhythm; normal distal peripheral pulses Abdomen-soft, nontender. Back-no CVA tenderness, patient does have some mild midline lumbosacral discomfort with palpation as well as mild thoracolumbar paraspinous spasm Extremities-no edema Skin- patient does not have any active infection or irritation of the skin beneath the pannus but the skin in this area is moist.  Patient with mild redness and hyperpigmentation of the skin beneath the breast and moistness of the skin consistent probable prior yeast infections and increased risk for recurrence Musculoskeletal/neuro- patient with decreased ability to perform flexion and extension of the left foot secondary to weakness patient with weakness also with eversion of the foot.  Patient otherwise has 5/5 upper and lower extremity strength.  Patient with mild tenderness as well as scar tissue of the right posterior heel/Achilles tendon insertion area with decreased range of motion with  flexion of the right foot.  Patient's cranial nerves are grossly intact      Assessment & Plan:  1. Acquired left foot drop Patient's neurology note reviewed.  Patient does have on exam findings consistent with a left foot drop.  Neurologist felt that this may be secondary to peroneal nerve injury from patient sitting throughout the day with her legs quality patient states that she is trying to stop doing this.  Patient had leg paperwork completed as patient will likely need for extended breaks during her workday or changes in position/standing/movement.  Patient will need follow-up with neurology as patient does have additional follow-up scheduled for further testing.  2. Anemia, unspecified type On review of chart, patient has had prior anemia and is status post gastric bypass.  Patient is encouraged to continue follow-up with gastric surgeon.  Patient is also encouraged to take the vitamins recommended by her surgeon status post gastric sleeve and she does have increased risk for vitamin B12 deficiency as well as increased risk of GI bleed status post surgery.  Patient should also contact her bariatric surgeon regarding  the need for H2 blocker or PPI therapy.  *Influenza immunization offered at today's visit but declined by the patient  An After Visit Summary was printed and given to the patient.  Return in about 6 months (around 09/25/2018).

## 2018-04-15 ENCOUNTER — Ambulatory Visit (INDEPENDENT_AMBULATORY_CARE_PROVIDER_SITE_OTHER): Payer: BLUE CROSS/BLUE SHIELD | Admitting: Neurology

## 2018-04-15 ENCOUNTER — Ambulatory Visit: Payer: BLUE CROSS/BLUE SHIELD | Admitting: Neurology

## 2018-04-15 ENCOUNTER — Encounter: Payer: Self-pay | Admitting: Neurology

## 2018-04-15 DIAGNOSIS — Z0289 Encounter for other administrative examinations: Secondary | ICD-10-CM

## 2018-04-15 DIAGNOSIS — G5732 Lesion of lateral popliteal nerve, left lower limb: Secondary | ICD-10-CM

## 2018-04-15 DIAGNOSIS — M21372 Foot drop, left foot: Secondary | ICD-10-CM

## 2018-04-15 HISTORY — DX: Lesion of lateral popliteal nerve, left lower limb: G57.32

## 2018-04-15 NOTE — Procedures (Signed)
     HISTORY:  Melinda Cross is a 47 year old patient with a history of a foot drop that developed on the left leg.  The patient has a history of crossing her legs left of her right.  She reports some tingling sensations on the dorsum of the left foot.  She is being evaluated for a possible neuropathy or a lumbosacral radiculopathy.  NERVE CONDUCTION STUDIES:  Nerve conduction studies were performed on both lower extremities. The distal motor latencies and motor amplitudes for the peroneal and posterior tibial nerves were within normal limits. The nerve conduction velocities for these nerves were also normal. The sensory latencies for the peroneal and sural nerves were within normal limits. The F wave latencies for the posterior tibial nerves were within normal limits.   EMG STUDIES:  EMG study was performed on the left lower extremity:  The tibialis anterior muscle reveals 2 to 3K motor units with decreased recruitment. 2+ fibrillations and positive waves were seen. The peroneus tertius muscle reveals 1 to 3K motor units with markedly decreased recruitment. 2+ fibrillations and positive waves were seen. The medial gastrocnemius muscle reveals 1 to 3K motor units with full recruitment. No fibrillations or positive waves were seen. The vastus lateralis muscle reveals 2 to 4K motor units with full recruitment. No fibrillations or positive waves were seen. The iliopsoas muscle reveals 2 to 4K motor units with full recruitment. No fibrillations or positive waves were seen. The biceps femoris muscle (long head) reveals 2 to 4K motor units with full recruitment. No fibrillations or positive waves were seen. The lumbosacral paraspinal muscles were tested at 3 levels, and revealed no abnormalities of insertional activity at all 3 levels tested. There was good relaxation.   IMPRESSION:  Nerve conduction studies on both lower extremities were unremarkable.  No evidence of a neuropathy was seen.  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  There is no evidence of an overlying lumbosacral radiculopathy.  Marlan Palau MD 04/15/2018 11:13 AM  [facility] [address] [place], [place] [address]  Phone [phone] Fax [phone]

## 2018-04-15 NOTE — Progress Notes (Signed)
Please refer to EMG and nerve conduction procedure note.  

## 2018-04-15 NOTE — Progress Notes (Signed)
[facility]    Nerve / Sites Muscle Latency Ref. Amplitude Ref. Rel Amp Segments Distance Velocity Ref. Area    ms ms mV mV %  cm m/s m/s mVms  R Peroneal - EDB     Ankle EDB 4.0 ?6.5 7.5 ?2.0 100 Ankle - EDB 9   20.9     Fib head EDB 10.8  6.7  90.2 Fib head - Ankle 30 44 ?44 19.9     Pop fossa EDB 13.1  6.6  97.3 Pop fossa - Fib head 10 44 ?44 20.5         Pop fossa - Ankle      L Peroneal - EDB     Ankle EDB 4.3 ?6.5 4.3 ?2.0 100 Ankle - EDB 9   15.3     Fib head EDB 11.1  4.1  95.7 Fib head - Ankle 31 46 ?44 14.7     Pop fossa EDB 13.1  3.9  96.2 Pop fossa - Fib head 10 51 ?44 13.1         Pop fossa - Ankle      R Tibial - AH     Ankle AH 3.7 ?5.8 9.8 ?4.0 100 Ankle - AH 9   19.9     Pop fossa AH 13.1  7.0  71.3 Pop fossa - Ankle 38 41 ?41 17.5  L Tibial - AH     Ankle AH 4.1 ?5.8 14.2 ?4.0 100 Ankle - AH 9   28.2     Pop fossa AH 12.8  9.4  66 Pop fossa - Ankle 38 44 ?41 23.5             [facility]    Nerve / Sites Rec. Site Peak Lat Ref.  Amp Ref. Segments Distance    ms ms V V  cm  R Sural - Ankle (Calf)     Calf Ankle 3.4 ?4.4 15 ?6 Calf - Ankle 14  L Sural - Ankle (Calf)     Calf Ankle 4.0 ?4.4 11 ?6 Calf - Ankle 14  R Superficial peroneal - Ankle     Lat leg Ankle 3.8 ?4.4 6 ?6 Lat leg - Ankle 14  L Superficial peroneal - Ankle     Lat leg Ankle 4.3 ?4.4 6 ?6 Lat leg - Ankle 14              F  Wave    Nerve F Lat Ref.   ms ms  R Tibial - AH 53.4 ?56.0  L Tibial - AH 53.6 ?56.0

## 2018-05-25 ENCOUNTER — Other Ambulatory Visit: Payer: Self-pay

## 2018-05-25 ENCOUNTER — Ambulatory Visit (HOSPITAL_COMMUNITY)
Admission: EM | Admit: 2018-05-25 | Discharge: 2018-05-25 | Disposition: A | Discharge status: Home / Self Care | Payer: BLUE CROSS/BLUE SHIELD | Attending: Family Medicine | Admitting: Family Medicine

## 2018-05-25 ENCOUNTER — Encounter (HOSPITAL_COMMUNITY): Payer: Self-pay | Admitting: Emergency Medicine

## 2018-05-25 DIAGNOSIS — M545 Low back pain, unspecified: Secondary | ICD-10-CM

## 2018-05-25 DIAGNOSIS — Z880 Allergy status to penicillin: Secondary | ICD-10-CM | POA: Insufficient documentation

## 2018-05-25 DIAGNOSIS — G629 Polyneuropathy, unspecified: Secondary | ICD-10-CM | POA: Insufficient documentation

## 2018-05-25 DIAGNOSIS — E119 Type 2 diabetes mellitus without complications: Secondary | ICD-10-CM | POA: Insufficient documentation

## 2018-05-25 LAB — POCT URINALYSIS DIP (DEVICE)
BILIRUBIN URINE: NEGATIVE
Glucose, UA: NEGATIVE mg/dL
KETONES UR: NEGATIVE mg/dL
NITRITE: NEGATIVE
PH: 5.5 (ref 5.0–8.0)
Protein, ur: NEGATIVE mg/dL
Specific Gravity, Urine: 1.025 (ref 1.005–1.030)
Urobilinogen, UA: 0.2 mg/dL (ref 0.0–1.0)

## 2018-05-25 NOTE — Discharge Instructions (Addendum)
It does not appear that you have a urinary tract infection at this time. We have sent your urine for further testing. You most likely have a muscle strain and spasms. Recommend take OTC Aleve 2 tablets every 12 hours as needed for pain. May apply warm compresses to area for comfort. Follow-up pending urine culture results.

## 2018-05-25 NOTE — ED Triage Notes (Signed)
[facility] a complaint of lower back pain x 5 days. The patient denied any known injury or any urinary symptoms however she did state that previous UTI's have started with the same symptoms.

## 2018-05-25 NOTE — ED Notes (Signed)
Urine sample collected

## 2018-05-26 NOTE — ED Provider Notes (Signed)
[facility]    CSN: [id number] Arrival date & time: 05/25/18  1546     History   Chief Complaint Chief Complaint  Patient presents with  . Back Pain    HPI Melinda Cross is a 47 y.o. female.   [age] year old female presents with left sided lower back pain for the past 5 days.  Pain tends to increase with certain movements.  She recently was on a cruise and thought she slept in a different position which may have contributed to her current symptoms.  She denies any radiation of pain or numbness.  She had similar symptoms in August 2019 which turned out to be a UTI. She had a CT scan of her lumbar area done which was essentially negative. She requests urinalysis today to rule out UTI.  She denies any fever, dysuria, hematuria or unusual vaginal discharge.  She has taken [facility] powders with some relief.  No other chronic health issues.  Takes no daily medication.  The history is provided by the patient.    Past Medical History:  Diagnosis Date  . Anemia    as a teenager  . Diabetes mellitus without complication (HCC)   . 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 Achilles tendon tear, right, sequela 05/28/2016    Past Surgical History:  Procedure Laterality Date  . ACHILLES TENDON SURGERY Right 12/02/2015   Procedure: Right Achilles Reconstruction;  Surgeon: Nadara MustardMarcus Duda V, MD;  [facility];  Service: Orthopedics;  Laterality: Right;  . sleeve surgery      OB History   None      Home Medications    Prior to Admission medications   Not on File    Family History Family History  Problem Relation Age of Onset  . Lung cancer Mother     Social History Social History   Tobacco Use  . Smoking status: Never Smoker  . Smokeless tobacco: Never Used  Substance Use Topics  . Alcohol use: Not Currently    Comment: occasional  . Drug use: No      Allergies   Penicillins   Review of Systems Review of Systems  Constitutional: Negative for activity change, appetite change, chills, fatigue and fever.  Respiratory: Negative for cough, chest tightness, shortness of breath and wheezing.   Cardiovascular: Negative for chest pain and palpitations.  Gastrointestinal: Negative for abdominal pain, constipation, diarrhea, nausea and vomiting.  Genitourinary: Positive for flank pain (left). Negative for decreased urine volume, difficulty urinating, dysuria, frequency, genital sores, hematuria, pelvic pain, urgency, vaginal bleeding, vaginal discharge and vaginal pain.  Musculoskeletal: Positive for back pain. Negative for arthralgias, gait problem, myalgias, neck pain and neck stiffness.  Skin: Negative for color change, rash and wound.  Allergic/Immunologic: Negative for immunocompromised state.  Neurological: Negative for dizziness, tremors, seizures, syncope, speech difficulty, weakness, light-headedness, numbness and headaches.  Hematological: Negative for adenopathy. Does not bruise/bleed easily.  Psychiatric/Behavioral: Negative.      Physical Exam Triage Vital Signs ED Triage Vitals  Enc Vitals Group     BP 05/25/18 1637 118/79     Pulse Rate 05/25/18 1637 88     Resp 05/25/18 1637 18     Temp 05/25/18 1637 98.2 F (36.8 C)     Temp Source 05/25/18 1637 Oral     SpO2 05/25/18 1637 100 %  Weight --      Height --      Head Circumference --      Peak Flow --      Pain Score 05/25/18 1635 2     Pain Loc --      Pain Edu? --      Excl. in GC? --    No data found.  Updated Vital Signs BP 118/79 (BP Location: Right Arm)   Pulse 88   Temp 98.2 F (36.8 C) (Oral)   Resp 18   LMP 05/14/2018   SpO2 100%   Visual Acuity Right Eye Distance:   Left Eye Distance:   Bilateral Distance:    Right Eye Near:   Left Eye Near:    Bilateral Near:     Physical Exam  Constitutional: She is oriented to person, place,  and time. Vital signs are normal. She appears well-developed and well-nourished. She is cooperative. She does not appear ill. No distress.  Patient sitting comfortably on exam table in no acute distress but has pain with changing positions.   HENT:  Head: Normocephalic and atraumatic.  Right Ear: External ear normal.  Left Ear: External ear normal.  Eyes: Conjunctivae and EOM are normal.  Neck: Normal range of motion. Neck supple.  Cardiovascular: Normal rate, regular rhythm and normal heart sounds.  No murmur heard. Pulmonary/Chest: Effort normal and breath sounds normal. No respiratory distress. She has no decreased breath sounds. She has no wheezes. She has no rhonchi.  Abdominal: Soft. Normal appearance and bowel sounds are normal. She exhibits no distension and no mass. There is no tenderness. There is no rigidity, no rebound, no guarding and no CVA tenderness.  Musculoskeletal: She exhibits tenderness.       Lumbar back: She exhibits decreased range of motion, tenderness and pain. She exhibits no swelling, no edema, no laceration, no spasm and normal pulse.       Back:  Has decreased range of motion of lower back, especially with flexion and rotation. Slightly tender on left lower lumbar area. No redness, swelling or rash present. No distinct muscle spasms present. No neuro deficits.   Lymphadenopathy:    She has no cervical adenopathy.  Neurological: She is alert and oriented to person, place, and time. She has normal strength and normal reflexes. No sensory deficit.  Skin: Skin is warm and dry. Capillary refill takes less than 2 seconds. No rash noted.  Psychiatric: She has a normal mood and affect. Her behavior is normal. Judgment and thought content normal.  Vitals reviewed.    UC Treatments / Results  Labs (all labs ordered are listed, but only abnormal results are displayed) Labs Reviewed  POCT URINALYSIS DIP (DEVICE) - Abnormal; Notable for the following components:       Result Value   Hgb urine dipstick TRACE (*)    Leukocytes, UA TRACE (*)    All other components within normal limits  URINE CULTURE    EKG None  Radiology No results found.  Procedures Procedures (including critical care time)  Medications Ordered in UC Medications - No data to display  Initial Impression / Assessment and Plan / UC Course  I have reviewed the triage vital signs and the nursing notes.  Pertinent labs & imaging results that were available during my care of the patient were reviewed by me and considered in my medical decision making (see chart for details).    Reviewed urinalysis results with patient- she just finished her period which  may explain the trace of blood in her urine. Otherwise, no significant findings for UTI. Will send urine for culture to confirm. Discussed that she probably has a lower lumbar muscle strain. Offered Rx strength NSAIDs but patient declined. Will continue OTC pain meds as needed- may take OTC Aleve 2 tablets every 12 hours as needed. Recommend follow-up pending urine culture results and with her PCP in 5 to 7 days if minimal improvement in symptoms.   Final Clinical Impressions(s) / UC Diagnoses   Final diagnoses:  Acute left-sided low back pain without sciatica     Discharge Instructions     It does not appear that you have a urinary tract infection at this time. We have sent your urine for further testing. You most likely have a muscle strain and spasms. Recommend take OTC Aleve 2 tablets every 12 hours as needed for pain. May apply warm compresses to area for comfort. Follow-up pending urine culture results.     ED Prescriptions    None     Controlled Substance Prescriptions [place] Controlled Substance Registry consulted? Not Applicable   Sudie Grumbling, NP 05/26/18 1204

## 2018-05-27 ENCOUNTER — Telehealth (HOSPITAL_COMMUNITY): Payer: Self-pay

## 2018-05-27 LAB — URINE CULTURE: SPECIAL REQUESTS: NORMAL

## 2018-05-27 MED ORDER — NITROFURANTOIN MONOHYD MACRO 100 MG PO CAPS: 100.0000 mg | ORAL_CAPSULE | Freq: Two times a day (BID) | ORAL | 0 refills | Status: DC

## 2018-05-27 NOTE — Telephone Encounter (Signed)
Urine culture positive for e.coli. This was not treated at visit. rx for Macrobid called in to pharmacy of choice.

## 2018-06-02 ENCOUNTER — Telehealth (HOSPITAL_COMMUNITY): Payer: Self-pay

## 2018-06-02 MED ORDER — SULFAMETHOXAZOLE-TRIMETHOPRIM 800-160 MG PO TABS: 1.0000 | ORAL_TABLET | Freq: Two times a day (BID) | ORAL | 0 refills | Status: AC

## 2018-06-02 NOTE — Telephone Encounter (Signed)
Spoke with patient and pt agreed to get RX for pharmacy that was sent for macrobid

## 2018-06-02 NOTE — Telephone Encounter (Signed)
Sent in RX for septra per [employer]

## 2018-09-03 ENCOUNTER — Telehealth: Payer: Self-pay | Admitting: Neurology

## 2018-09-03 NOTE — Telephone Encounter (Signed)
Pt advised [employer] faxed FMLA forms last night. Pt was transferred to medical records to discuss the payment. FYI

## 2018-09-03 NOTE — Telephone Encounter (Signed)
Noted. Will wait for forms to be received.

## 2018-09-09 NOTE — Telephone Encounter (Signed)
Pt wanting to know if the forms were rec'd. Please call to advise at [phone]

## 2018-09-11 ENCOUNTER — Telehealth: Payer: Self-pay | Admitting: *Deleted

## 2018-09-11 DIAGNOSIS — Z0289 Encounter for other administrative examinations: Secondary | ICD-10-CM

## 2018-09-11 NOTE — Telephone Encounter (Signed)
R/c from [employer] pt form on [employer].

## 2018-09-12 NOTE — Telephone Encounter (Signed)
Form was completed by Dr. Anne Hahn. Sent to MR for processing.

## 2018-09-23 ENCOUNTER — Telehealth: Payer: Self-pay | Admitting: Neurology

## 2018-09-23 NOTE — Telephone Encounter (Signed)
I called the patient.  The patient is slowly improving with her foot drop, she is making progress.  There is no reason for a revisit here, FMLA form indicates that there is no need for further follow-up.

## 2018-09-23 NOTE — Telephone Encounter (Signed)
Pt states she is off on tomorrow and would like for Dr Anne Hahn to call her anytime on tomorrow.

## 2018-09-23 NOTE — Telephone Encounter (Signed)
I called the patient, left a message.  The patient apparently has FMLA for a left peroneal neuropathy, she apparently missed a day work on 17 September 2018, I am not sure the circumstances in this regard, the patient has not been seen in this office since 15 April 2018, she may require another revisit.  I will try calling back later today.

## 2018-09-24 NOTE — Telephone Encounter (Signed)
Updated form completed/signed by Dr. Anne Hahn. Forms fwd back to medical records for processing.

## 2019-09-24 ENCOUNTER — Ambulatory Visit: Payer: BLUE CROSS/BLUE SHIELD | Attending: Internal Medicine

## 2019-09-24 DIAGNOSIS — Z23 Encounter for immunization: Secondary | ICD-10-CM

## 2019-09-24 NOTE — Progress Notes (Signed)
   Covid-19 Vaccination Clinic  Name:  Melinda Cross    MRN: [id number] DOB: 11-14-1970  09/24/2019  [name] was observed post Covid-19 immunization for 15 minutes without incident. She was provided with Vaccine Information Sheet and instruction to access the V-Safe system.   [name] was instructed to call 911 with any severe reactions post vaccine: Marland Kitchen Difficulty breathing  . Swelling of face and throat  . A fast heartbeat  . A bad rash all over body  . Dizziness and weakness   Immunizations Administered    Name Date Dose VIS Date Route   Pfizer COVID-19 Vaccine 09/24/2019  8:56 AM 0.3 mL 06/19/2019 Intramuscular   Manufacturer: [employer], [employer]   Lot: [id number]   NDC: [address]-4

## 2019-10-19 ENCOUNTER — Ambulatory Visit: Payer: BLUE CROSS/BLUE SHIELD | Attending: Internal Medicine

## 2019-10-19 DIAGNOSIS — Z23 Encounter for immunization: Secondary | ICD-10-CM

## 2019-10-19 NOTE — Progress Notes (Signed)
   Covid-19 Vaccination Clinic  Name:  Melinda Cross    MRN: [id number] DOB: 1971/04/20  10/19/2019  [name] was observed post Covid-19 immunization for 15 minutes without incident. She was provided with Vaccine Information Sheet and instruction to access the V-Safe system.   [name] was instructed to call 911 with any severe reactions post vaccine: Marland Kitchen Difficulty breathing  . Swelling of face and throat  . A fast heartbeat  . A bad rash all over body  . Dizziness and weakness   Immunizations Administered    Name Date Dose VIS Date Route   Pfizer COVID-19 Vaccine 10/19/2019  8:30 AM 0.3 mL 06/19/2019 Intramuscular   Manufacturer: [employer], [employer]   Lot: [id number]   NDC: [address]-1

## 2019-12-30 IMAGING — CT CT ABD-PELV W/ CM
2 of 5 series · 17 of 46 positions shown, 19 images · IV contrast (ISOVUE)
Comparison: None.

CLINICAL DATA: Left-sided flank pain for 1 month.

EXAM:
CT ABDOMEN AND PELVIS WITH CONTRAST
TECHNIQUE: Multidetector CT imaging of the abdomen and pelvis was performed
using the standard protocol following bolus administration of
intravenous contrast.
CONTRAST:  100mL FBUMNT-AOO IOPAMIDOL (FBUMNT-AOO) INJECTION 61%

[Series 2: axial st · axial · 0.93mm/px · z∈[-442,-42]mm · 14 of 92 slices shown, 16 images]
[im 6/92  soft-tissue]
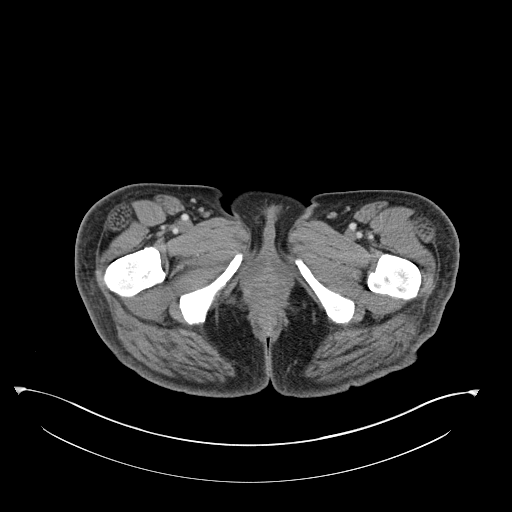
[im 6/92  bone]
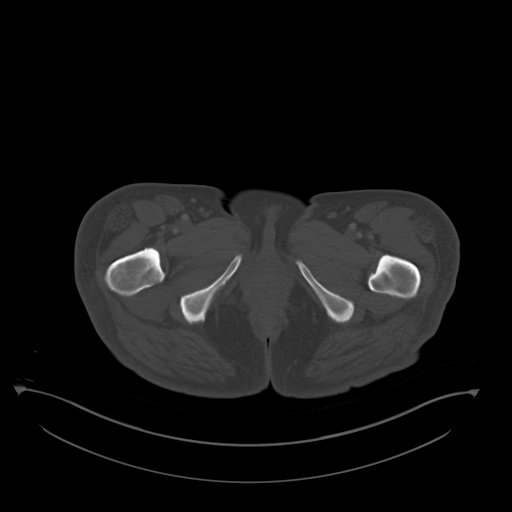
[im 12/92  soft-tissue]
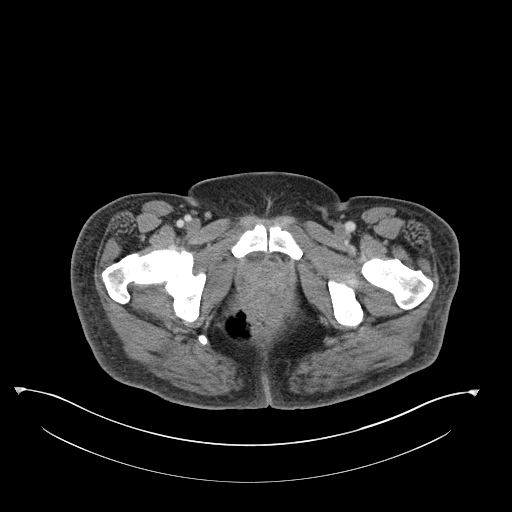
[im 18/92  soft-tissue]
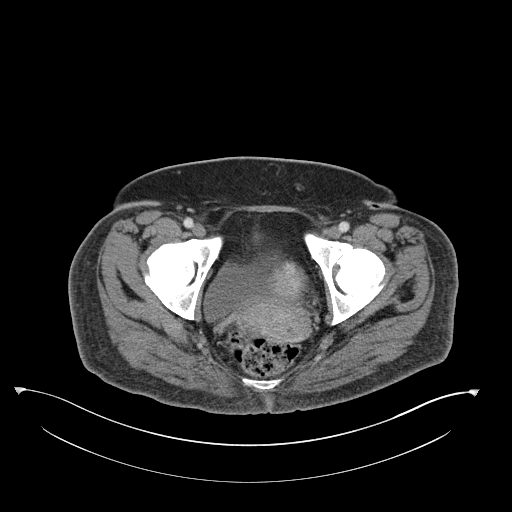
[im 23/92  soft-tissue]
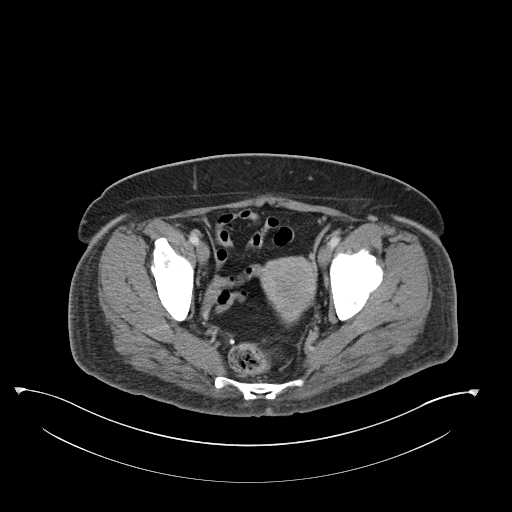
[im 29/92  soft-tissue]
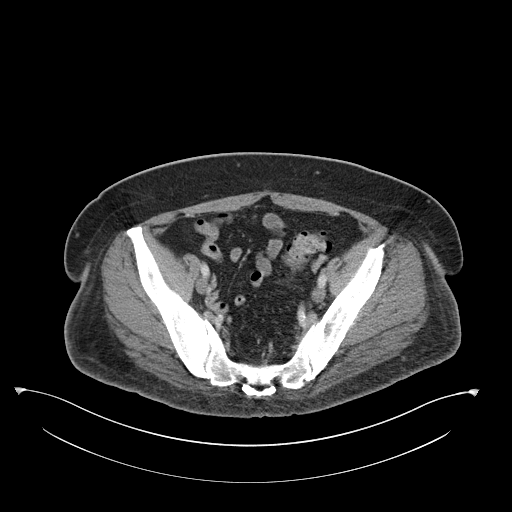
[im 35/92  soft-tissue]
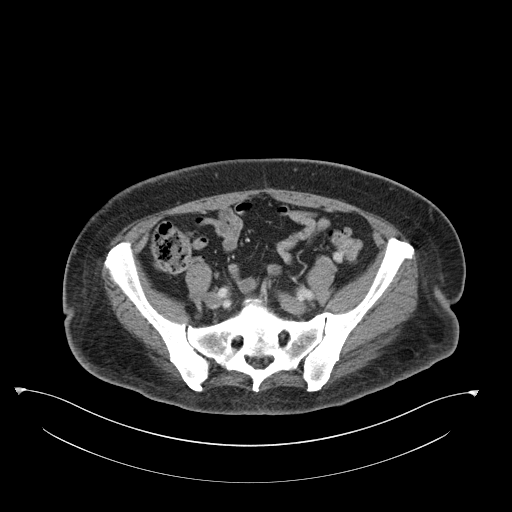
[im 40/92  soft-tissue]
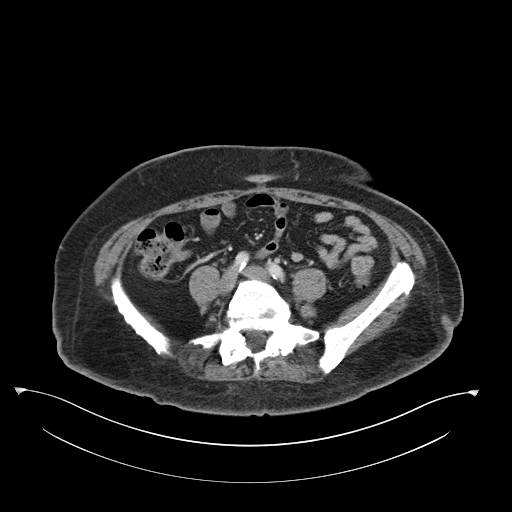
[im 52/92  soft-tissue]
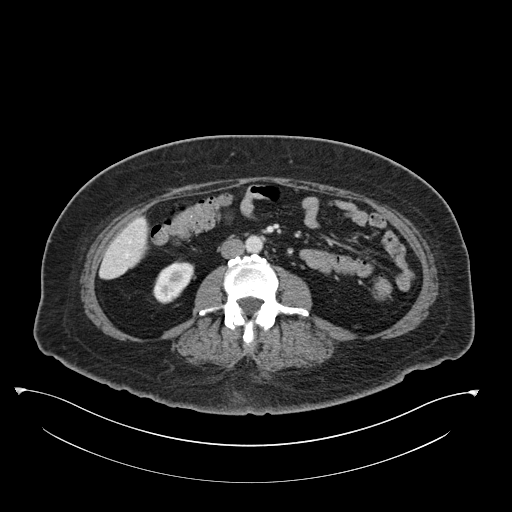
[im 57/92  soft-tissue]
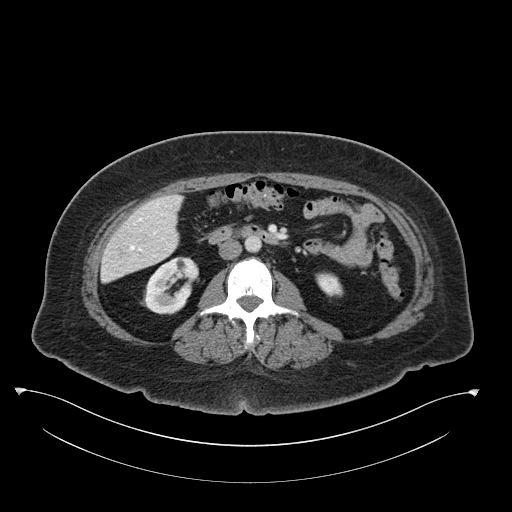
[im 57/92  bone]
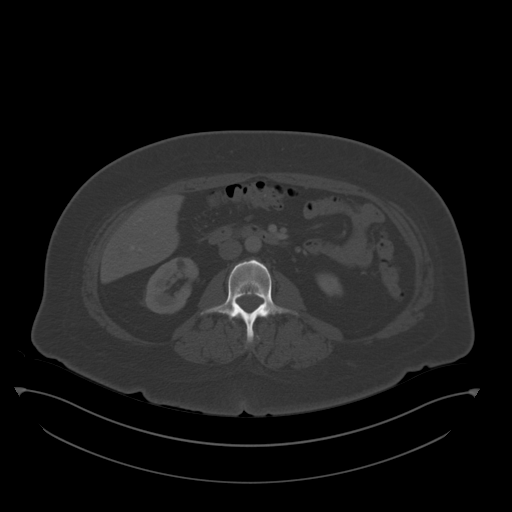
[im 63/92  soft-tissue]
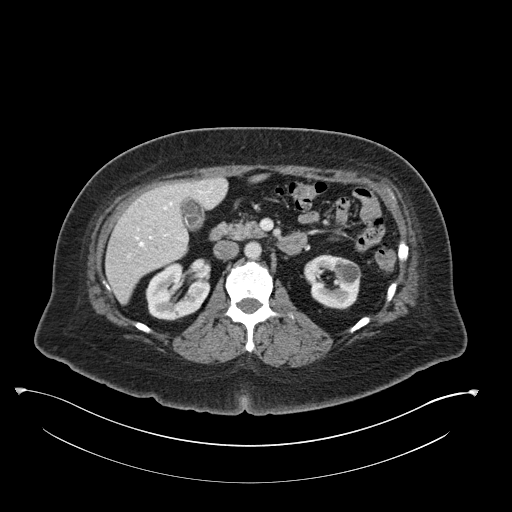
[im 69/92  soft-tissue]
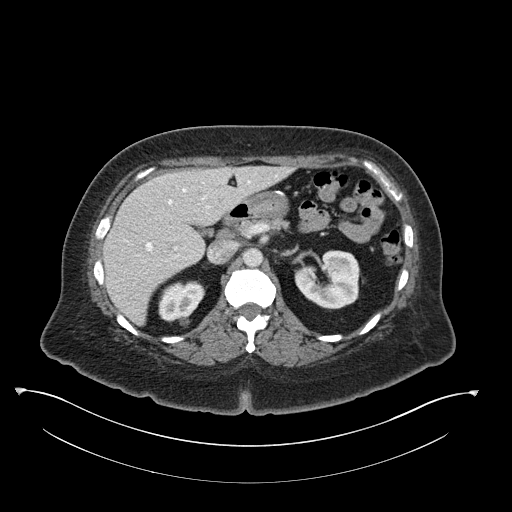
[im 74/92  soft-tissue]
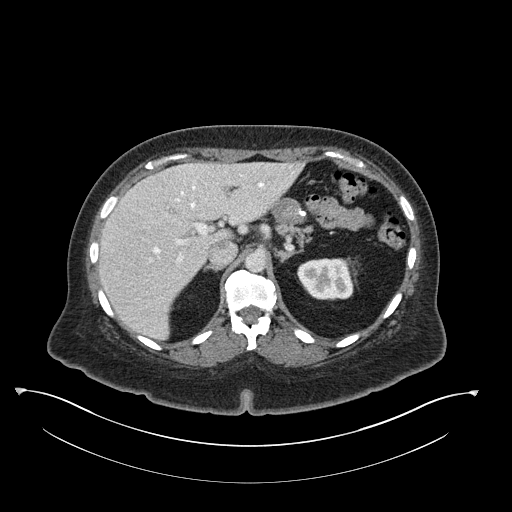
[im 80/92  soft-tissue]
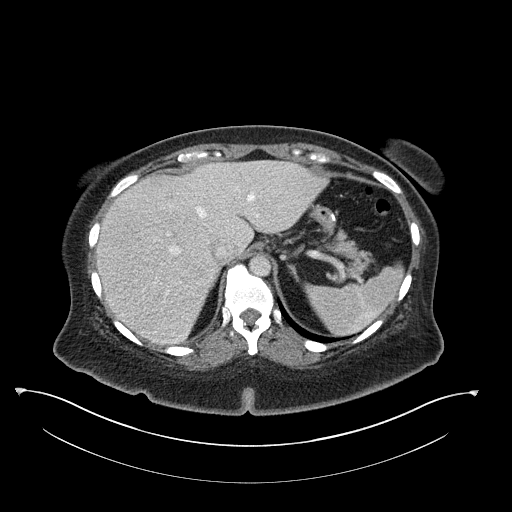
[im 86/92  soft-tissue]
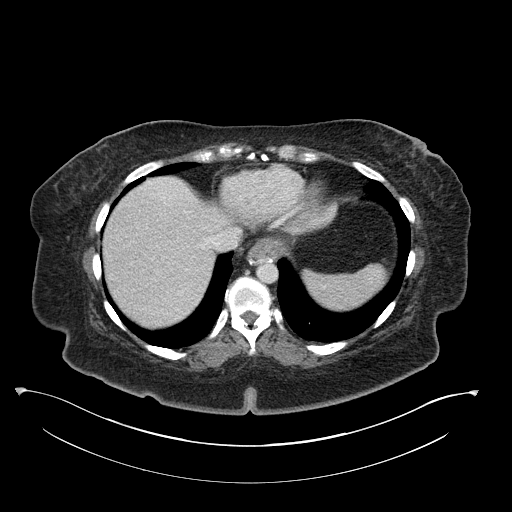

[Series 5: coronal st · coronal · 0.81mm/px · 3 of 87 slices shown]
[im 29/87  soft-tissue]
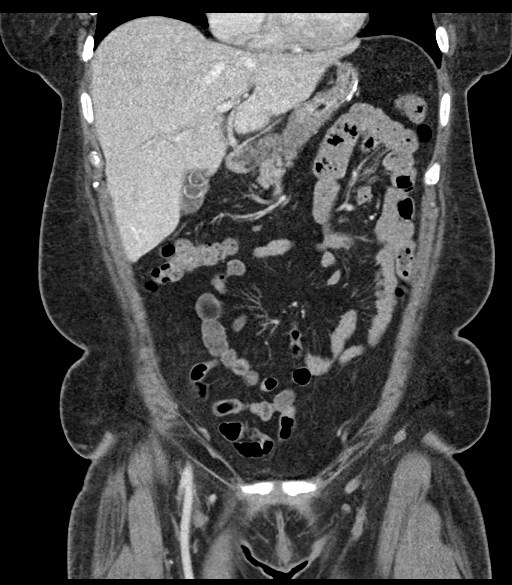
[im 39/87  soft-tissue]
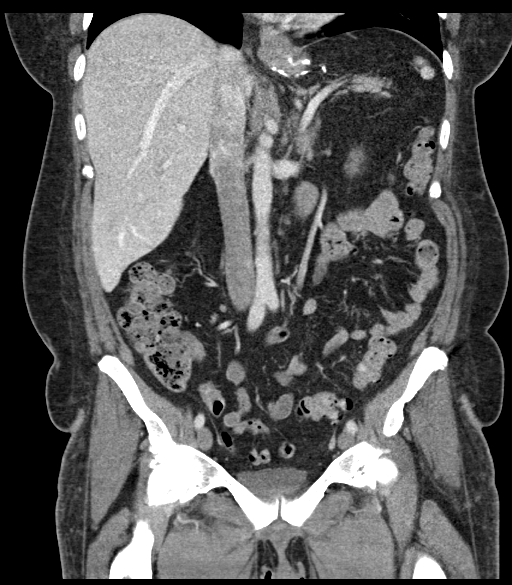
[im 48/87  soft-tissue]
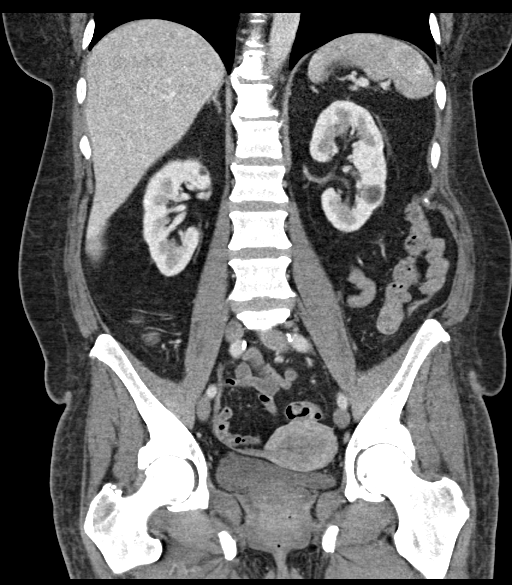

[17 of 46 positions shown; findings below may reference images not displayed]

FINDINGS: Lower chest: No acute abnormality.

Hepatobiliary: Cholelithiasis is noted without inflammation. No
biliary dilatation is noted. The liver is unremarkable.

Pancreas: Unremarkable. No pancreatic ductal dilatation or
surrounding inflammatory changes.

Spleen: Normal in size without focal abnormality.

Adrenals/Urinary Tract: Adrenal glands appear normal. Bilateral
renal cysts are noted. No hydronephrosis or renal obstruction is
noted. No renal or ureteral calculi are noted. Urinary bladder is
unremarkable.

Stomach/Bowel: Status post gastric surgery. The appendix appears
normal. There is no evidence of bowel obstruction or inflammation.
Sigmoid diverticulosis is noted without inflammation.

Vascular/Lymphatic: No significant vascular findings are present. No
enlarged abdominal or pelvic lymph nodes.

Reproductive: Uterus and bilateral adnexa are unremarkable.

Other: No abdominal wall hernia or abnormality. No abdominopelvic
ascites.

Musculoskeletal: No acute or significant osseous findings.
IMPRESSION: Cholelithiasis without inflammation.

Sigmoid diverticulosis without inflammation.

No acute abnormality seen in the abdomen or pelvis.

## 2019-12-30 IMAGING — MR MR LUMBAR SPINE WO/W CM
4 of 7 series · 19 of 48 positions shown · IV contrast (yes)
Comparison: CT same day.  Lumbar radiographs same day.

CLINICAL DATA: Left-sided flank pain over the last month, worse
with standing. Occasional left foot pain.

EXAM:
MRI LUMBAR SPINE WITHOUT AND WITH CONTRAST
TECHNIQUE: Multiplanar and multiecho pulse sequences of the lumbar spine were
obtained without and with intravenous contrast.
CONTRAST:  17mL MULTIHANCE GADOBENATE DIMEGLUMINE 529 MG/ML IV SOLN

[Series 3: T1 · sagittal · 4.0mm · 0.51mm/px · 3 of 13 slices shown (1 of 2)]
[im 1/13]
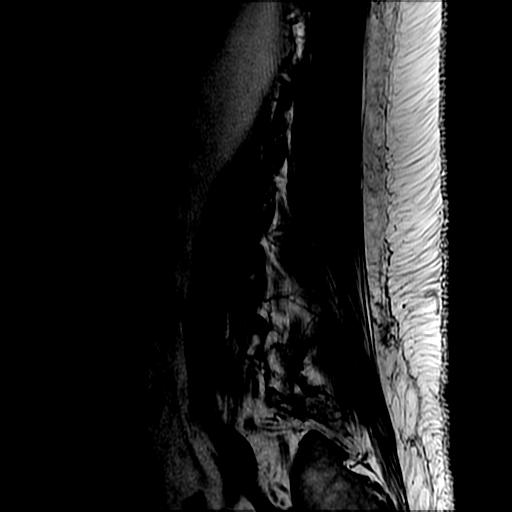
[im 7/13]
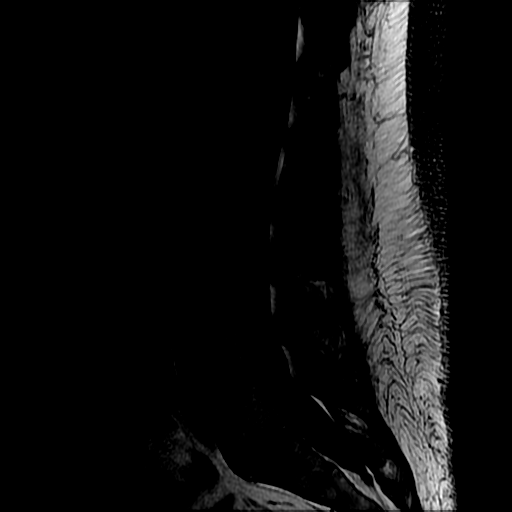
[im 13/13]
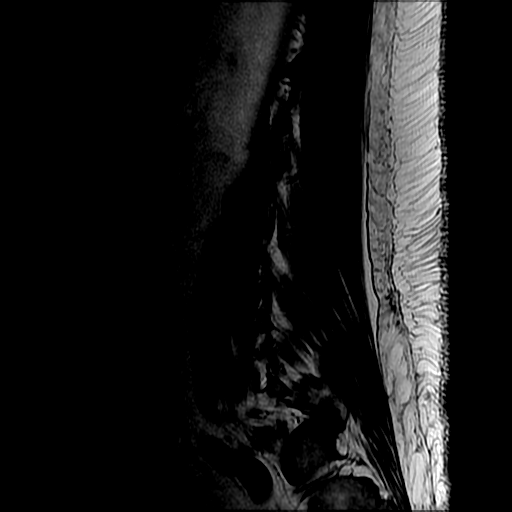

[Series 4: T2 · sagittal · 4.0mm · 0.51mm/px · 4 of 13 slices shown (1 of 2)]
[im 1/13]
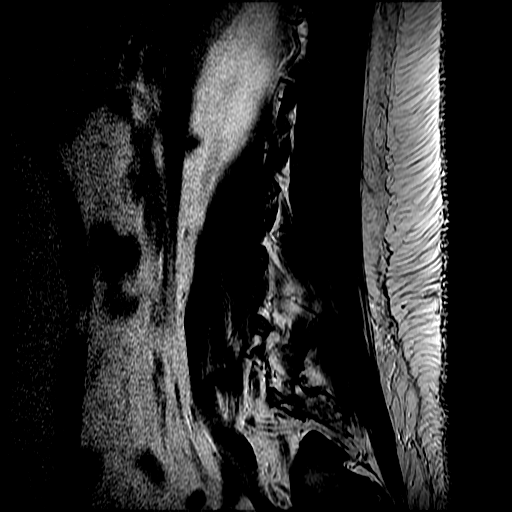
[im 5/13]
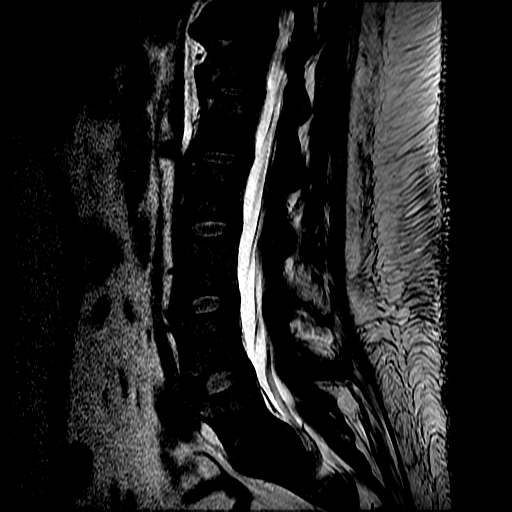
[im 9/13]
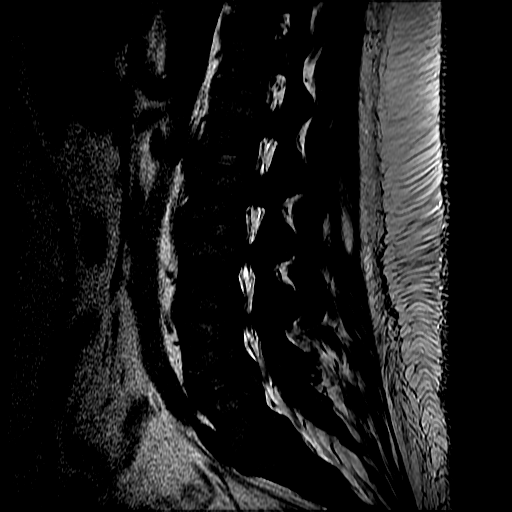
[im 13/13]
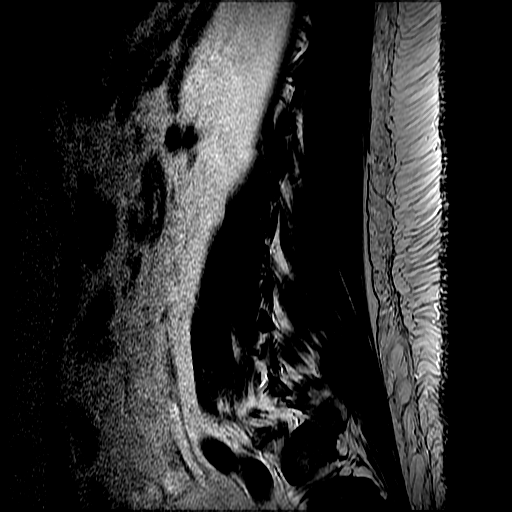

[Series 6: T2 · axial · 4.0mm · 0.39mm/px · z∈[-100,+94]mm · 9 of 41 slices shown (2 of 2)]
[im 1/41]
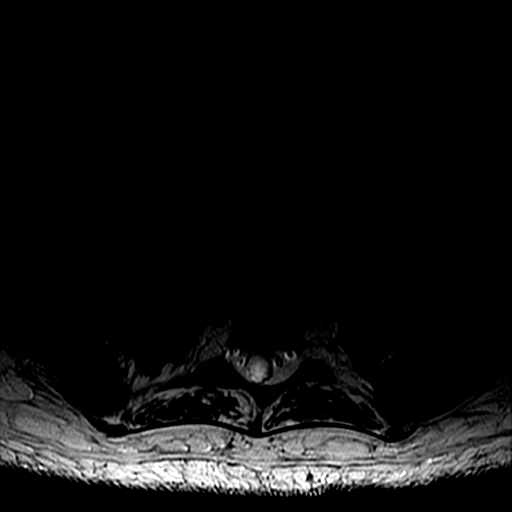
[im 5/41]
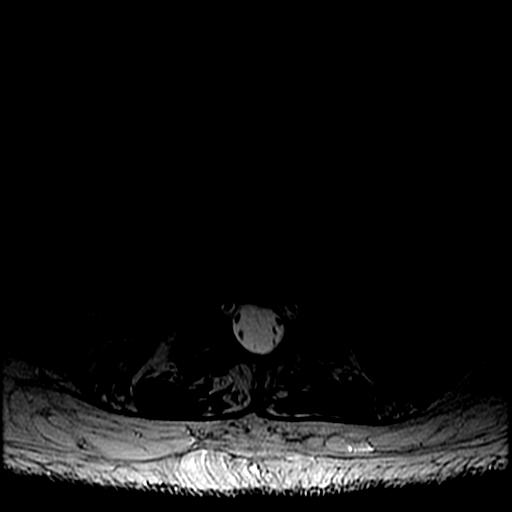
[im 9/41]
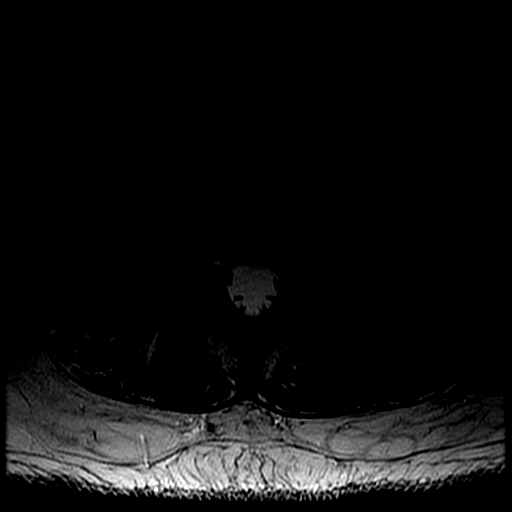
[im 13/41]
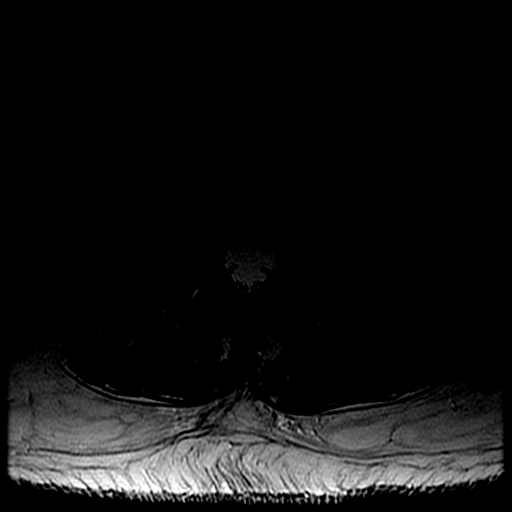
[im 17/41]
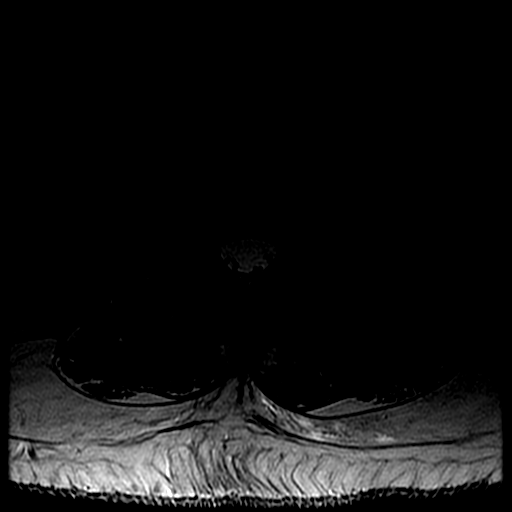
[im 21/41]
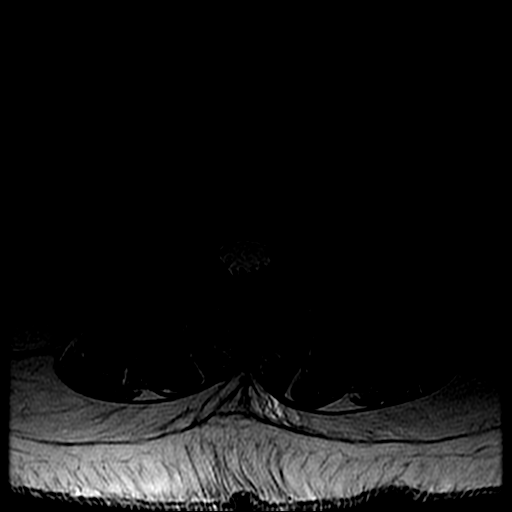
[im 25/41]
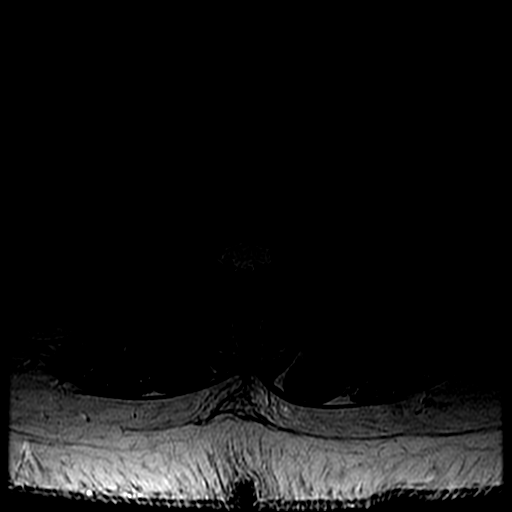
[im 29/41]
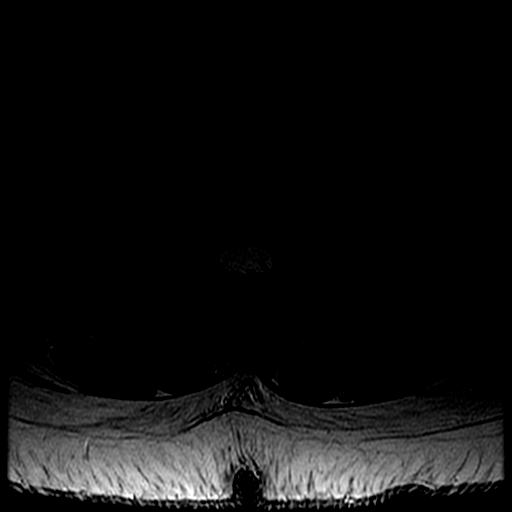
[im 37/41]
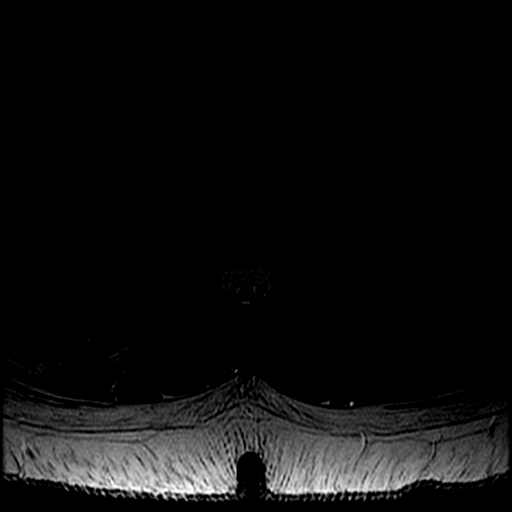

[Series 7: T1 · axial · 4.0mm · 0.39mm/px · z∈[-80,+94]mm · 3 of 41 slices shown (2 of 2)]
[im 5/41]
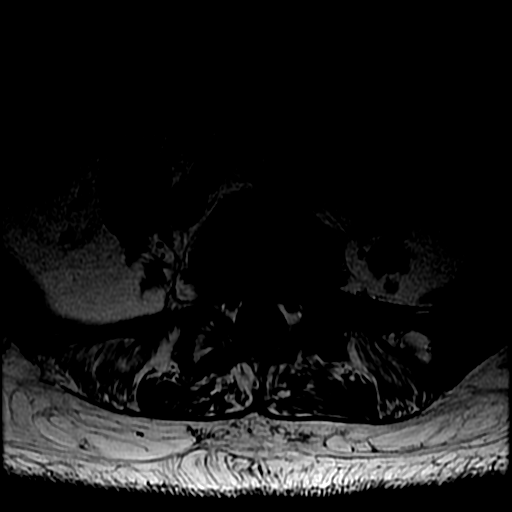
[im 21/41]
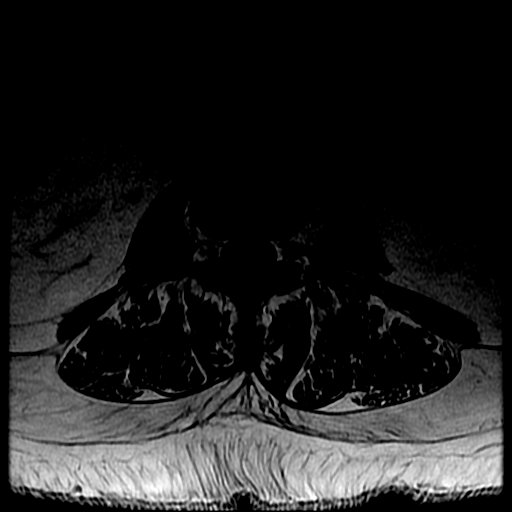
[im 37/41]
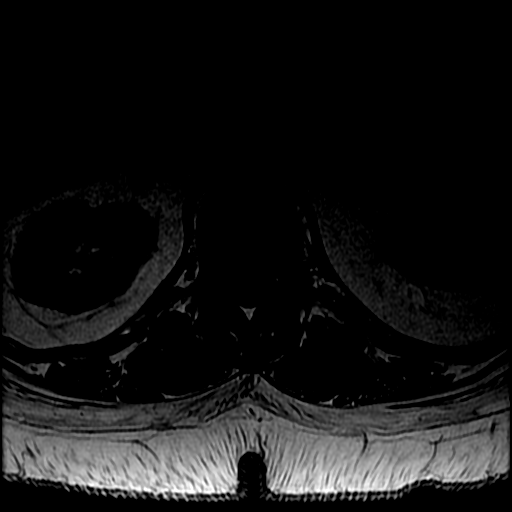

[19 of 48 positions shown; findings below may reference images not displayed]

FINDINGS: Segmentation:  5 lumbar type vertebral bodies.

Alignment: 3 mm anterolisthesis L5-S1 due to chronic facet
arthropathy.

Vertebrae:  No fracture or primary bone lesion.

Conus medullaris and cauda equina: Conus extends to the L1 level.
Conus and cauda equina appear normal.

Paraspinal and other soft tissues: Simple appearing renal cysts.
Gallstones.

Disc levels:

Ordinary anterior osteophytes at T11-12 and T12-L1. no disc
degeneration or posterior disc bulge or herniation. Normal
appearance of the discs at L1-2, L2-3 and L3-4. No posterior element
pathology.

L4-5: Bilateral facet osteoarthritis with mild edema and
enhancement. No disc degeneration, bulge or herniation. No stenosis.
The facet arthropathy could be a cause of low back pain or referred
facet syndrome pain.

L5-S1: Chronic facet arthropathy, with apparent fusion of the facets
on the right. 3 mm of chronic anterolisthesis at this level,
probably fixed. No disc herniation or stenosis.
IMPRESSION: L4-5: Bilateral facet arthropathy with edema and mild enhancement.
This could be a cause of low back pain and or referred facet
syndrome pain.

L5-S1: Chronic facet arthropathy, apparently with fusion at least on
the right and probably on the left. No stenosis at this level.

## 2021-11-08 ENCOUNTER — Ambulatory Visit (HOSPITAL_COMMUNITY)
Admission: EM | Admit: 2021-11-08 | Discharge: 2021-11-08 | Disposition: A | Discharge status: Home / Self Care | Payer: BLUE CROSS/BLUE SHIELD | Attending: Family Medicine | Admitting: Family Medicine

## 2021-11-08 ENCOUNTER — Encounter (HOSPITAL_COMMUNITY): Payer: Self-pay

## 2021-11-08 DIAGNOSIS — S161XXA Strain of muscle, fascia and tendon at neck level, initial encounter: Secondary | ICD-10-CM | POA: Diagnosis not present

## 2021-11-08 DIAGNOSIS — M25521 Pain in right elbow: Secondary | ICD-10-CM | POA: Diagnosis not present

## 2021-11-08 DIAGNOSIS — M25561 Pain in right knee: Secondary | ICD-10-CM

## 2021-11-08 MED ORDER — CYCLOBENZAPRINE HCL 10 MG PO TABS: ORAL_TABLET | ORAL | 0 refills | Status: DC

## 2021-11-08 MED ORDER — DICLOFENAC SODIUM 75 MG PO TBEC: 75.0000 mg | DELAYED_RELEASE_TABLET | Freq: Two times a day (BID) | ORAL | 0 refills | Status: DC

## 2021-11-08 NOTE — ED Triage Notes (Signed)
Pt states she was shopping when she entered the refrigerator section and the floor was not sealed and when she stepped she fell thorough. Pt states she bumped her head and hit her right knee. Pt states pain all over her right side. ?

## 2021-11-11 NOTE — ED Provider Notes (Signed)
?[facility] ? ? ?[id number] ?11/08/21 Arrival Time: 1544 ? ?ASSESSMENT & PLAN: ? ?1. Acute pain of right knee   ?2. Right elbow pain   ?3. Strain of neck muscle, initial encounter   ? ?Declines imaging of knee today. "Just feel more bruised and sore". ?Activities as tolerated. ?Begin: ?Discharge Medication List as of 11/08/2021  5:08 PM  ?  ? ?START taking these medications  ? Details  ?cyclobenzaprine (FLEXERIL) 10 MG tablet Take 1 tablet by mouth 3 times daily as needed for muscle spasm. Warning: May cause drowsiness., Normal  ?  ?diclofenac (VOLTAREN) 75 MG EC tablet Take 1 tablet (75 mg total) by mouth 2 (two) times daily., Starting Wed 11/08/2021, Normal  ?  ?  ? ?Recommend: ? Follow-up Information   ? ? [facility].   ?Specialty: Urgent Care ?Why: If worsening or failing to improve as anticipated. ?Contact information: ?[address] ?[place] [address] ?[phone] ? ?  ?  ? ?  ?  ? ?  ? ?Reviewed expectations re: course of current medical issues. Questions answered. ?Outlined signs and symptoms indicating need for more acute intervention. ?Patient verbalized understanding. ?After Visit Summary given. ? ?SUBJECTIVE: ?History from: patient. ?Melinda Cross is a 51 y.o. female who reports R knee and R elbow pain s/p fall. Knee vs ground. Unsure if she hit elbow or not. No extremity sensation changes or weakness. Ambulatory. Generalized soreness. No tx PTA. ?No head injury. ? ?Past Surgical History:  ?Procedure Laterality Date  ? ACHILLES TENDON SURGERY Right 12/02/2015  ? Procedure: Right Achilles Reconstruction;  Surgeon: Nadara Mustard, MD;  Location: [facility] OR;  Service: Orthopedics;  Laterality: Right;  ? sleeve surgery    ?  ? ? ?OBJECTIVE: ? ?Vitals:  ? 11/08/21 1628  ?BP: 131/86  ?Pulse: 79  ?Resp: 18  ?Temp: 98.4 ?F (36.9 ?C)  ?TempSrc: Oral  ?SpO2: 96%  ?  ?General appearance: alert; no distress ?HEENT: Van; AT ?Neck: supple with FROM ?Resp: unlabored  respirations ?Extremities: ?RUE: warm with well perfused appearance; no significant tenderness over right elbow; without gross deformities; swelling: none; bruising: none; elbow ROM: normal ?RLE: warm with well perfused appearance; poorly localized moderate tenderness over right anterior knee; without gross deformities; swelling: none; small anterior abrasion; knee ROM: normal ?CV: brisk extremity capillary refill of all extremities ?Skin: warm and dry; no visible rashes ?Neurologic: gait normal but favors RLE; normal sensation and strength of all extremities ?Psychological: alert and cooperative; normal mood and affect ? ? ? ?Allergies  ?Allergen Reactions  ? Penicillins Hives, Itching and Swelling  ?  Has patient had a PCN reaction causing immediate rash, facial/tongue/throat swelling, SOB or lightheadedness with hypotension: Yes ?Has patient had a PCN reaction causing severe rash involving mucus membranes or skin necrosis: No ?Has patient had a PCN reaction that required hospitalization No ?Has patient had a PCN reaction occurring within the last 10 years: Yes ?If all of the above answers are "NO", then may proceed with Cephalosporin use. ?  ? ? ?Past Medical History:  ?Diagnosis Date  ? Anemia   ? as a teenager  ? Diabetes mellitus without complication (HCC)   ? 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 ?Social History  ? ?Socioeconomic History  ? Marital status: Single  ?  Spouse name: Not on file  ? Number of children: 0  ? Years of education: 22  ? Highest education level: Not on file  ?  Occupational History  ? Not on file  ?Tobacco Use  ? Smoking status: Never  ? Smokeless tobacco: Never  ?Vaping Use  ? Vaping Use: Never used  ?Substance and Sexual Activity  ? Alcohol use: Not Currently  ?  Comment: occasional  ? Drug use: No  ? Sexual activity: Not on file  ?  Comment: patient stated she stopped about 2 weeks ago.  ?Other Topics Concern  ? Not on file  ?Social History Narrative  ? Livesalone  ? Caffeine  use: none  ? Right handed   ? ?Social Determinants of Health  ? ?Financial Resource Strain: Not on file  ?Food Insecurity: Not on file  ?Transportation Needs: Not on file  ?Physical Activity: Not on file  ?Stress: Not on file  ?Social Connections: Not on file  ? ?Family History  ?Problem Relation Age of Onset  ? Lung cancer Mother   ? ?Past Surgical History:  ?Procedure Laterality Date  ? ACHILLES TENDON SURGERY Right 12/02/2015  ? Procedure: Right Achilles Reconstruction;  Surgeon: Nadara Mustard, MD;  Location: [facility] OR;  Service: Orthopedics;  Laterality: Right;  ? sleeve surgery    ? ? ? ?  ?Mardella Layman, MD ?11/11/21 1448 ? ?

## 2021-11-19 ENCOUNTER — Other Ambulatory Visit: Payer: Self-pay

## 2021-11-19 ENCOUNTER — Ambulatory Visit (INDEPENDENT_AMBULATORY_CARE_PROVIDER_SITE_OTHER): Payer: BLUE CROSS/BLUE SHIELD

## 2021-11-19 ENCOUNTER — Ambulatory Visit
Admission: EM | Admit: 2021-11-19 | Discharge: 2021-11-19 | Disposition: A | Discharge status: Home / Self Care | Payer: BLUE CROSS/BLUE SHIELD | Attending: Urgent Care | Admitting: Urgent Care

## 2021-11-19 ENCOUNTER — Encounter: Payer: Self-pay | Admitting: Emergency Medicine

## 2021-11-19 DIAGNOSIS — M25531 Pain in right wrist: Secondary | ICD-10-CM | POA: Diagnosis not present

## 2021-11-19 DIAGNOSIS — W19XXXA Unspecified fall, initial encounter: Secondary | ICD-10-CM

## 2021-11-19 DIAGNOSIS — S6981XA Other specified injuries of right wrist, hand and finger(s), initial encounter: Secondary | ICD-10-CM

## 2021-11-19 DIAGNOSIS — G5603 Carpal tunnel syndrome, bilateral upper limbs: Secondary | ICD-10-CM

## 2021-11-19 MED ORDER — ETODOLAC 500 MG PO TABS: 500.0000 mg | ORAL_TABLET | Freq: Two times a day (BID) | ORAL | 0 refills | Status: AC

## 2021-11-19 NOTE — ED Triage Notes (Signed)
Pt sts she fell 2 weeks ago and injured wrist; pt sts continued pain with swelling and trouble closing her fist ?

## 2021-11-19 NOTE — ED Provider Notes (Signed)
?[facility] ? ? ? ?CSN: [id number] ?Arrival date & time: 11/19/21  0818 ? ? ?  ? ?History   ?Chief Complaint ?Chief Complaint  ?Patient presents with  ? Wrist Pain  ? ? ?HPI ?Melinda Cross is a 51 y.o. female.  ? ?Pleasant 51yo female who presents today 2 weeks after a FOOSH injury. 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 She states the crate moved, and she landed on her outstretched R wrist two weeks ago. She reports pain primarily to the ulnar aspect of her wrist. Flexion, extension and supination of wrist cause increased pain. She feels like it is swollen. She has not been taking anything routinely OTC for treatment. She does admit that even prior to the injury, she has been having bilateral hand/ finger tingling, worse in the mornings. She discussed with her PCP but no tx offered to date.  ? ? ?Wrist Pain ? ? ?Past Medical History:  ?Diagnosis Date  ? Anemia   ? as a teenager  ? Diabetes mellitus without complication (HCC)   ? 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 ? ?Patient Active Problem List  ? Diagnosis Date Noted  ? 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 Achilles tendon tear, right, sequela 05/28/2016  ? ? ?Past Surgical History:  ?Procedure Laterality Date  ? ACHILLES TENDON SURGERY Right 12/02/2015  ? Procedure: Right Achilles Reconstruction;  Surgeon: Nadara Mustard, MD;  Location: [facility] OR;  Service: Orthopedics;  Laterality: Right;  ? sleeve surgery    ? ? ?OB History   ?No obstetric history on file. ?  ? ? ? ?Home Medications   ? ?Prior to Admission medications   ?Medication Sig Start Date End Date Taking? Authorizing Provider  ?etodolac (LODINE) 500 MG tablet Take 1 tablet (500 mg total) by mouth 2 (two) times daily for 7 days. 11/19/21 11/26/21 Yes Deajah Erkkila L, PA  ? ? ?Family History ?Family History  ?Problem Relation Age of Onset  ? Lung cancer Mother   ? ? ?Social History ?Social History  ? ?Tobacco Use  ? Smoking status:  Never  ? Smokeless tobacco: Never  ?Vaping Use  ? Vaping Use: Never used  ?Substance Use Topics  ? Alcohol use: Not Currently  ?  Comment: occasional  ? Drug use: No  ? ? ? ?Allergies   ?Penicillins ? ? ?Review of Systems ?Review of Systems  ?Musculoskeletal:  Positive for arthralgias (R wrist pain).  ?Neurological:  Positive for numbness (bilateral hands/ fingers).  ?All other systems reviewed and are negative. ? ? ?Physical Exam ?Triage Vital Signs ?ED Triage Vitals  ?Enc Vitals Group  ?   BP 11/19/21 0840 136/70  ?   Pulse Rate 11/19/21 0840 76  ?   Resp 11/19/21 0840 18  ?   Temp 11/19/21 0840 98 ?F (36.7 ?C)  ?   Temp Source 11/19/21 0840 Oral  ?   SpO2 11/19/21 0840 95 %  ?   Weight --   ?   Height --   ?   Head Circumference --   ?   Peak Flow --   ?   Pain Score 11/19/21 0841 7  ?   Pain Loc --   ?   Pain Edu? --   ?   Excl. in GC? --   ? ?No data found. ? ?Updated Vital Signs ?BP 136/70 (BP Location: Left Arm)  Pulse 76   Temp 98 ?F (36.7 ?C) (Oral)   Resp 18   SpO2 95%  ? ?Visual Acuity ?Right Eye Distance:   ?Left Eye Distance:   ?Bilateral Distance:   ? ?Right Eye Near:   ?Left Eye Near:    ?Bilateral Near:    ? ?Physical Exam ?Vitals and nursing note reviewed.  ?Constitutional:   ?   General: She is not in acute distress. ?   Appearance: Normal appearance. She is obese. She is not ill-appearing or toxic-appearing.  ?HENT:  ?   Head: Normocephalic.  ?Musculoskeletal:  ?   Right forearm: Tenderness and bony tenderness present. No swelling, edema, deformity or lacerations.  ?   Left forearm: Normal. No swelling, edema, deformity, lacerations or tenderness.  ?   Right wrist: Tenderness present. No swelling, deformity, effusion, lacerations, snuff box tenderness or crepitus. Normal range of motion. Normal pulse.  ?   Left wrist: Normal. No swelling or tenderness. Normal range of motion.  ?   Right hand: Normal. Normal range of motion. Normal strength.  ?   Left hand: Normal. Normal range of motion.  Normal strength.  ?   Comments: Tenderness to palpation of the ulnar styloid and TFCC ligaments on R only ?Positive tinel sign  ?Neurological:  ?   Mental Status: She is alert.  ? ? ? ?UC Treatments / Results  ?Labs ?(all labs ordered are listed, but only abnormal results are displayed) ?Labs Reviewed - No data to display ? ?EKG ? ? ?Radiology ?DG Wrist Complete Right ? ?Result Date: 11/19/2021 ?CLINICAL DATA:  Fall 2 weeks ago with right wrist injury EXAM: RIGHT WRIST - COMPLETE 3+ VIEW COMPARISON:  None Available. FINDINGS: There is no evidence of fracture or dislocation. There is no evidence of arthropathy or other focal bone abnormality. Soft tissues are unremarkable. IMPRESSION: Negative. Electronically Signed   By: Tiburcio Pea M.D.   On: 11/19/2021 09:11   ? ?Procedures ?Procedures (including critical care time) ? ?Medications Ordered in UC ?Medications - No data to display ? ?Initial Impression / Assessment and Plan / UC Course  ?I have reviewed the triage vital signs and the nursing notes. ? ?Pertinent labs & imaging results that were available during my care of the patient were reviewed by me and considered in my medical decision making (see chart for details). ? ?  ? ?Acute R wrist pain - suspect TFCC injury given location of pain. Xray does not reveal any acute abnormalities. Will place pt in wrist brace to prevent excessive ROM. NSAID as discussed with food BID x 7 days. F/U with sports medicine for recheck and further workup if deemed necessary ?Bilateral carpal tunnel - will defer workup and management to PCP, however suspect R hand will improve with nightly use of the brace. ? ?Final Clinical Impressions(s) / UC Diagnoses  ? ?Final diagnoses:  ?Wrist pain, acute, right  ?Injury of triangular fibrocartilage complex (TFCC) of right wrist, initial encounter  ?Bilateral carpal tunnel syndrome  ? ? ? ?Discharge Instructions   ? ?  ?Your xray is negative. ?You most likely have a TFCC injury, please see  handout ?Please follow up with sports medicine in 1-2 weeks ?Please take the NSAID prescribed twice daily with food to prevent an upset stomach. ?Please wear the wrist brace until follow up ?You most likely have bilateral carpal tunnel. This should improve slightly with use of the brace. ? ? ? ? ? ?ED Prescriptions   ? ? Medication Sig  Dispense Auth. Provider  ? etodolac (LODINE) 500 MG tablet Take 1 tablet (500 mg total) by mouth 2 (two) times daily for 7 days. 14 tablet Sierrah Luevano L, PA  ? ?  ? ?PDMP not reviewed this encounter. ?  Maretta Bees?Haizlee Henton L, [place]PA ?11/19/21 [id number] ? ?

## 2021-11-19 NOTE — Discharge Instructions (Addendum)
Your xray is negative. ?You most likely have a TFCC injury, please see handout. ?Please follow up with sports medicine in 1-2 weeks. ?Please take the NSAID prescribed twice daily with food to prevent an upset stomach. ?Please wear the wrist brace until follow up. ?You most likely have bilateral carpal tunnel. This should improve slightly with use of the brace. ? ?

## 2021-12-09 ENCOUNTER — Encounter (HOSPITAL_COMMUNITY): Payer: Self-pay

## 2021-12-09 ENCOUNTER — Emergency Department (HOSPITAL_COMMUNITY)
Admission: EM | Admit: 2021-12-09 | Discharge: 2021-12-09 | Disposition: A | Discharge status: Home / Self Care | Payer: BLUE CROSS/BLUE SHIELD | Attending: Emergency Medicine | Admitting: Emergency Medicine

## 2021-12-09 DIAGNOSIS — M25532 Pain in left wrist: Secondary | ICD-10-CM | POA: Insufficient documentation

## 2021-12-09 DIAGNOSIS — M25531 Pain in right wrist: Secondary | ICD-10-CM | POA: Diagnosis not present

## 2021-12-09 MED ORDER — IBUPROFEN 800 MG PO TABS: 800.0000 mg | ORAL_TABLET | Freq: Three times a day (TID) | ORAL | 0 refills | Status: DC

## 2021-12-09 NOTE — ED Triage Notes (Signed)
Pt presents with c/o bilateral hand pain. Pt reports she fell several weeks ago, landing on her hands and the pain has not seemed to get better since then. Pt was seen at [facility] after the fall. Pt was hoping for an MRI of her hands and UC told them they were unable to do that. Pt describes the pain as achy.

## 2021-12-09 NOTE — Discharge Instructions (Addendum)
Your exam today is reassuring.  Continue taking Tylenol as needed for pain.  Given that you state naproxen has not been helping much I have sent ibuprofen into the pharmacy for you.  Please stop taking naproxen while you are taking ibuprofen.  Make sure you take the ibuprofen with food.  Continue wearing your splint.  You can also wear nighttime carpal tunnel splint and alternate the wrist you put that on each night.  We are not able to do an MRI in the emergency room today.  However I do recommend that you keep your appointment with your primary care provider for Tuesday and the orthopedist office for Wednesday for follow-up and if they deem appropriate they will order the MRI.  If you have any worsening symptoms you can return to the emergency room for evaluation.

## 2021-12-09 NOTE — ED Provider Notes (Signed)
[facility]-EMERGENCY DEPT Provider Note   CSN: [id number] Arrival date & time: 12/09/21  1041     History  Chief Complaint  Patient presents with   Hand Pain    Melinda Cross is a 51 y.o. female.  51 year old female presents today for evaluation of bilateral wrist pain.  Patient had a fall about 3 weeks ago while at a restaurant supply store.  She was evaluated at urgent care following that and started on naproxen which she has intermittently been compliant with, has been taking Tylenol, has been wearing a brace over the right wrist.  She states she still has ongoing symptoms.  Symptoms are worse in the morning.  Denies any issues prior to fall few weeks ago.  She was referred to sports medicine.  Has an appointment upcoming with sports medicine clinic on Wednesday.  She presents to the emergency room for further evaluation with MRI.  The history is provided by the patient. No language interpreter was used.      Home Medications Prior to Admission medications   Not on File      Allergies    Penicillins    Review of Systems   Review of Systems  Constitutional:  Negative for chills and fever.  Musculoskeletal:  Positive for arthralgias. Negative for myalgias.  Neurological:  Negative for weakness.  All other systems reviewed and are negative.  Physical Exam Updated Vital Signs BP 130/78 (BP Location: Left Arm)   Pulse 96   Temp 99.8 F (37.7 C) (Oral)   Resp 18   SpO2 94%  Physical Exam Vitals and nursing note reviewed.  Constitutional:      General: She is not in acute distress.    Appearance: Normal appearance. She is not ill-appearing.  HENT:     Head: Normocephalic and atraumatic.     Nose: Nose normal.  Eyes:     Conjunctiva/sclera: Conjunctivae normal.  Cardiovascular:     Rate and Rhythm: Normal rate and regular rhythm.  Pulmonary:     Effort: Pulmonary effort is normal. No respiratory distress.  Musculoskeletal:        General: No  deformity.     Comments: Full range of motion in bilateral upper extremities including wrist and all digits of bilateral hands.  2+ radial pulse present bilaterally.  Minimal tenderness to palpation present over the wrist joint.  Without swelling, or deformity.  Brisk cap refill bilaterally.  Neurovascularly intact.  Skin:    Findings: No rash.  Neurological:     Mental Status: She is alert.    ED Results / Procedures / Treatments   Labs (all labs ordered are listed, but only abnormal results are displayed) Labs Reviewed - No data to display  EKG None  Radiology No results found.  Procedures Procedures    Medications Ordered in ED Medications - No data to display  ED Course/ Medical Decision Making/ A&P                           Medical Decision Making  51 year old female presents with bilateral wrist pain.  She had a fall about 3 weeks ago and evaluated at urgent care and started on symptomatic treatment and referred to orthopedist.  She has not followed up with orthopedics yet but has an appointment coming up Wednesday.  She presents today because she is having ongoing pain and would like to have an MRI done for further evaluation.  Patient's  symptoms are stable.  Without concerning findings on exam.  Has full range of motion, and minimal tenderness.  Symptomatic treatment discussed.  She has not been taking naproxen because has not been providing much relief.  Will prescribe her 100 mg of ibuprofen.  Discussed with her that she needs to take this with food.  Discussed to not take naproxen along with ibuprofen at the same time as they are both anti-inflammatories.  Discussed course of follow-up with her PCP which she has scheduled for Tuesday and orthopedist which she has scheduled for Wednesday.  Patient voices understanding and is in agreement with plan.  Return precautions discussed.   Final Clinical Impression(s) / ED Diagnoses Final diagnoses:  Pain in both wrists    Rx  / DC Orders ED Discharge Orders          Ordered    ibuprofen (ADVIL) 800 MG tablet  3 times daily        12/09/21 1210              Marita Kansas, PA-C 12/09/21 1234    Bethann Berkshire, MD 12/12/21 1137

## 2022-02-02 ENCOUNTER — Ambulatory Visit
Admission: EM | Admit: 2022-02-02 | Discharge: 2022-02-02 | Disposition: A | Discharge status: Home / Self Care | Payer: BLUE CROSS/BLUE SHIELD | Attending: Physician Assistant | Admitting: Physician Assistant

## 2022-02-02 DIAGNOSIS — S90811A Abrasion, right foot, initial encounter: Secondary | ICD-10-CM | POA: Diagnosis not present

## 2022-02-02 DIAGNOSIS — L089 Local infection of the skin and subcutaneous tissue, unspecified: Secondary | ICD-10-CM

## 2022-02-02 MED ORDER — DOXYCYCLINE HYCLATE 100 MG PO CAPS: 100.0000 mg | ORAL_CAPSULE | Freq: Two times a day (BID) | ORAL | 0 refills | Status: AC

## 2022-02-02 MED ORDER — MUPIROCIN 2 % EX OINT: 1.0000 | TOPICAL_OINTMENT | Freq: Every day | CUTANEOUS | 0 refills | Status: AC

## 2022-02-02 NOTE — ED Provider Notes (Signed)
[facility]    CSN: [id number] Arrival date & time: 02/02/22  1913      History   Chief Complaint Chief Complaint  Patient presents with   Wound Check    HPI Melinda Cross is a 51 y.o. female.   Patient presents today with a several day history of wound on her right posterior ankle.  Reports that symptoms began after she went to a weight loss class where she started some new exercises.  She reports that this area has become swollen and she has noticed some drainage.  She reports having a Achilles repair multiple years ago on the same ankle but denies additional surgeries.  She does have a history of diabetes but this has been well controlled with diet alone since weight loss surgery several years ago.  She reports some discomfort in this area particularly with ambulation.  She denies any decreased range of motion, significant pain, fever, redness, swelling.  Denies any recent antibiotic use or recurrent skin infections.  She has been keeping this area clean and applying Neosporin with minimal improvement of symptoms.    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      Home Medications    Prior to Admission medications   Medication Sig Start Date End Date Taking? Authorizing Provider  doxycycline (VIBRAMYCIN) 100 MG capsule Take 1 capsule (100 mg total) by mouth 2 (two) times daily. 02/02/22  Yes Aamilah Augenstein,  Denny Peon K, PA-C  mupirocin ointment (BACTROBAN) 2 % Apply 1 Application topically daily. 02/02/22  Yes Tykel Badie, Noberto Retort, PA-C    Family History Family History  Problem Relation Age of Onset   Lung cancer Mother     Social History Social History   Tobacco Use   Smoking status: Never   Smokeless tobacco: Never  Vaping Use   Vaping Use: Never used  Substance Use Topics   Alcohol use: Not Currently    Comment: occasional   Drug use: No     Allergies   Penicillins   Review of Systems Review of Systems  Constitutional:  Positive for activity change. Negative for appetite change, fatigue and fever.  Musculoskeletal:  Negative for arthralgias, gait problem, joint swelling and myalgias.  Skin:  Positive for wound. Negative for color change.  Neurological:  Negative for weakness and numbness.     Physical Exam Triage Vital Signs ED Triage Vitals  Enc Vitals Group     BP 02/02/22 1924 116/75     Pulse Rate 02/02/22 1924 70     Resp 02/02/22 1924 20     Temp 02/02/22 1924 97.7 F (36.5 C)     Temp Source 02/02/22 1924 Oral     SpO2 02/02/22 1924 97 %     Weight --      Height --      Head Circumference --  Peak Flow --      Pain Score 02/02/22 1925 0     Pain Loc --      Pain Edu? --      Excl. in GC? --    No data found.  Updated Vital Signs BP 116/75 (BP Location: Left Arm)   Pulse 70   Temp 97.7 F (36.5 C) (Oral)   Resp 20   SpO2 97%   Visual Acuity Right Eye Distance:   Left Eye Distance:   Bilateral Distance:    Right Eye Near:   Left Eye Near:    Bilateral Near:     Physical Exam Vitals reviewed.  Constitutional:      General: She is awake. She is not in acute distress.    Appearance: Normal appearance. She is well-developed. She is not ill-appearing.     Comments: Very pleasant female appears stated age in no acute distress sitting comfortably in exam room  HENT:     Head: Normocephalic and atraumatic.  Cardiovascular:     Rate and  Rhythm: Normal rate and regular rhythm.     Heart sounds: Normal heart sounds, S1 normal and S2 normal. No murmur heard. Pulmonary:     Effort: Pulmonary effort is normal.     Breath sounds: Normal breath sounds. No wheezing, rhonchi or rales.     Comments: Clear to auscultation bilaterally Musculoskeletal:     Right ankle: No swelling or lacerations. No tenderness. Normal range of motion.       Legs:     Comments: 1 cm wound with surrounding erythema and associated tenderness to palpation.  No active bleeding or drainage noted.  Normal active range of motion at right ankle.  Strength 5/5 at ankle.  Foot neurovascularly intact.  Psychiatric:        Behavior: Behavior is cooperative.      UC Treatments / Results  Labs (all labs ordered are listed, but only abnormal results are displayed) Labs Reviewed - No data to display  EKG   Radiology No results found.  Procedures Procedures (including critical care time)  Medications Ordered in UC Medications - No data to display  Initial Impression / Assessment and Plan / UC Course  I have reviewed the triage vital signs and the nursing notes.  Pertinent labs & imaging results that were available during my care of the patient were reviewed by me and considered in my medical decision making (see chart for details).     Suspect that symptoms began from a blister/friction wound that occurred during her workout session.  This appears to infected.  We will start doxycycline 100 mg twice daily for 10 days.  She was instructed to avoid prolonged sun exposure while on this medication due to associated photosensitivity.  Recommended she keep area clean and apply Bactroban ointment.  She can use Tylenol ibuprofen as needed for pain.  Discussed that if her symptoms do not improving quickly or if she has any worsening symptoms including worsening wound, spread of redness, change in drainage, fever, nausea, vomiting she needs to be seen immediately.   Strict return precautions given to which she expressed understanding.  Final Clinical Impressions(s) / UC Diagnoses   Final diagnoses:  Skin infection  Abrasion of right heel with infection     Discharge Instructions      It appears you have a small wound that is becoming infected.  Start doxycycline 100 mg twice daily for 10 days.  Stay out of the sun  while on this medication.  Keep this area clean with soap and water.  Apply Bactroban ointment.  If you have any worsening symptoms including enlarging lesion, spread of redness, change in drainage, fever, nausea, vomiting you need to be seen immediately.    ED Prescriptions     Medication Sig Dispense Auth. Provider   doxycycline (VIBRAMYCIN) 100 MG capsule Take 1 capsule (100 mg total) by mouth 2 (two) times daily. 20 capsule Monseratt Ledin K, PA-C   mupirocin ointment (BACTROBAN) 2 % Apply 1 Application topically daily. 22 g Josedaniel Haye K, PA-C      PDMP not reviewed this encounter.   Jeani Hawking, PA-C 02/02/22 1956

## 2022-02-02 NOTE — ED Triage Notes (Signed)
Pt c/o right heel wound check. Reports surgery several years ago.   X 2days ago

## 2022-02-02 NOTE — Discharge Instructions (Signed)
It appears you have a small wound that is becoming infected.  Start doxycycline 100 mg twice daily for 10 days.  Stay out of the sun while on this medication.  Keep this area clean with soap and water.  Apply Bactroban ointment.  If you have any worsening symptoms including enlarging lesion, spread of redness, change in drainage, fever, nausea, vomiting you need to be seen immediately.

## 2023-09-22 ENCOUNTER — Encounter (HOSPITAL_BASED_OUTPATIENT_CLINIC_OR_DEPARTMENT_OTHER): Payer: Self-pay | Admitting: Emergency Medicine

## 2023-09-22 ENCOUNTER — Other Ambulatory Visit: Payer: Self-pay

## 2023-09-22 ENCOUNTER — Emergency Department (HOSPITAL_BASED_OUTPATIENT_CLINIC_OR_DEPARTMENT_OTHER): Admitting: Radiology

## 2023-09-22 ENCOUNTER — Emergency Department (HOSPITAL_BASED_OUTPATIENT_CLINIC_OR_DEPARTMENT_OTHER)
Admission: EM | Admit: 2023-09-22 | Discharge: 2023-09-22 | Disposition: A | Discharge status: Home / Self Care | Attending: Emergency Medicine | Admitting: Emergency Medicine

## 2023-09-22 DIAGNOSIS — M25562 Pain in left knee: Secondary | ICD-10-CM | POA: Insufficient documentation

## 2023-09-22 NOTE — Discharge Instructions (Addendum)
 Today you were seen for left knee pain.  You may alternate taking Tylenol Motrin as needed for pain and swelling.  May also rest, ice, compress, and elevate the affected limb.  Please follow-up with orthopedics if your symptoms persist for further evaluation and treatment.  Thank you for letting us treat you today. After performing a physical exam and reviewing your imaging, I feel you are safe to go home. Please follow up with your PCP in the next several days and provide them with your records from this visit. Return to the Emergency Room if pain becomes severe or symptoms worsen.

## 2023-09-22 NOTE — ED Triage Notes (Signed)
 Pt c/o LT knee swelling and weakness x  4 days Denies injury Rec'd 2nd shingles vaccine 1 week pta.

## 2023-09-22 NOTE — ED Provider Notes (Cosign Needed Addendum)
 [facility] Provider Note   CSN: [id number] Arrival date & time: 09/22/23  1802     History  Chief Complaint  Patient presents with   Joint Swelling    Melinda Cross is a 53 y.o. female presents today for left knee swelling x 4 days.  Patient denies pain or known injury.  Patient does state that sometimes it feels like her knee gives out on her.  Patient denies fever, chills, history of blood clots, hormone use, tobacco use, recent surgery or long period of immobilization.  HPI     Home Medications Prior to Admission medications   Medication Sig Start Date End Date Taking? Authorizing Provider  doxycycline (VIBRAMYCIN) 100 MG capsule Take 1 capsule (100 mg total) by mouth 2 (two) times daily. 02/02/22   Raspet, Noberto Retort, PA-C  mupirocin ointment (BACTROBAN) 2 % Apply 1 Application topically daily. 02/02/22   Raspet, Noberto Retort, PA-C      Allergies    Penicillins    Review of Systems   Review of Systems  Musculoskeletal:  Positive for arthralgias.    Physical Exam Updated Vital Signs BP 132/75   Pulse 86   Temp 97.9 F (36.6 C)   Resp 16   Wt 115.2 kg   LMP 09/14/2023   SpO2 98%   BMI 37.51 kg/m  Physical Exam Constitutional:      General: She is not in acute distress.    Appearance: Normal appearance. She is normal weight. She is not ill-appearing, toxic-appearing or diaphoretic.  HENT:     Head: Normocephalic and atraumatic.     Mouth/Throat:     Mouth: Mucous membranes are moist.  Eyes:     Extraocular Movements: Extraocular movements intact.  Cardiovascular:     Pulses: Normal pulses.  Pulmonary:     Effort: Pulmonary effort is normal.  Musculoskeletal:        General: Normal range of motion.     Comments: Patient has small effusion to the medial aspect of her left knee.  Patient has no tenderness to palpation, weakness, or laxity with anterior, posterior, varus or valgus stress.  Knee is not warm or erythematous.   No calf swelling, no posterior knee tenderness, negative Homans' sign, +2 dorsalis pedis pulses.  Skin:    General: Skin is warm and dry.     Capillary Refill: Capillary refill takes less than 2 seconds.  Neurological:     General: No focal deficit present.     ED Results / Procedures / Treatments   Labs (all labs ordered are listed, but only abnormal results are displayed) Labs Reviewed - No data to display  EKG None  Radiology DG Knee Complete 4 Views Left Result Date: 09/22/2023 CLINICAL DATA:  Left knee swelling.  No known injury EXAM: LEFT KNEE - COMPLETE 4+ VIEW COMPARISON:  None Available. FINDINGS: No evidence of fracture or dislocation. Small knee joint effusion. Mild medial and patellofemoral compartment osteoarthritis. Soft tissues are unremarkable. IMPRESSION: 1. Mild medial and patellofemoral compartment osteoarthritis. 2. Small knee joint effusion. Electronically Signed   By: Duanne Guess D.O.   On: 09/22/2023 18:57    Procedures Procedures    Medications Ordered in ED Medications - No data to display  ED Course/ Medical Decision Making/ A&P                                 Medical  Decision Making Amount and/or Complexity of Data Reviewed Radiology: ordered.   This patient presents to the ED with chief complaint(s) of left knee swelling with pertinent past medical history of none which further complicates the presenting complaint. The complaint involves an extensive differential diagnosis and also carries with it a high risk of complications and morbidity.    The differential diagnosis includes septic joint, patellar fracture, distal femur fracture, proximal tibia fracture, arthritis, musculoskeletal pain, DVT  Additional history obtained: Records reviewed Care Everywhere/External Records  ED Course and Reassessment: Patient given knee brace   Independent visualization of imaging: - I independently visualized the following imaging with scope of  interpretation limited to determining acute life threatening conditions related to emergency care: Left knee x-ray, which revealed mild medial and patellofemoral compartment osteoarthritis, small knee joint effusion  Consultation: - Consulted or discussed management/test interpretation w/ external professional: None  Consideration for admission or further workup: Sitter for mission further workup however patient vital signs, physical exam, and imaging were reassuring.  Patient symptoms likely due to musculoskeletal pain.  Patient placed in knee brace and advised to take Tylenol and Motrin as needed for pain and swelling.  Patient given orthopedic contact information for further evaluation and treatment if her symptoms persist.        Final Clinical Impression(s) / ED Diagnoses Final diagnoses:  Acute pain of left knee    Rx / DC Orders ED Discharge Orders     None         Dolphus Jenny, PA-C 09/22/23 2153    Dolphus Jenny, PA-C 09/22/23 2155    Arby Barrette, MD 09/26/23 [phone]
